# Patient Record
Sex: Female | Born: 1955 | Race: White | Hispanic: No | Marital: Married | Smoking: Former smoker
Health system: Southern US, Community
[De-identification: ages and names within clinical notes are randomized; demographics above are authoritative.]

## PROBLEM LIST (undated history)

## (undated) DIAGNOSIS — E119 Type 2 diabetes mellitus without complications: Secondary | ICD-10-CM

## (undated) DIAGNOSIS — I10 Essential (primary) hypertension: Secondary | ICD-10-CM

## (undated) DIAGNOSIS — Z923 Personal history of irradiation: Secondary | ICD-10-CM

## (undated) HISTORY — PX: DILATION AND CURETTAGE OF UTERUS: SHX78

---

## 1992-06-28 HISTORY — PX: DERMOID CYST  EXCISION: SHX1452

## 2008-10-22 ENCOUNTER — Other Ambulatory Visit: Admission: RE | Admit: 2008-10-22 | Discharge: 2008-10-22 | Payer: Self-pay | Admitting: Family Medicine

## 2009-10-23 ENCOUNTER — Other Ambulatory Visit: Admission: RE | Admit: 2009-10-23 | Discharge: 2009-10-23 | Payer: Self-pay | Admitting: Family Medicine

## 2010-12-28 ENCOUNTER — Other Ambulatory Visit (HOSPITAL_COMMUNITY)
Admission: RE | Admit: 2010-12-28 | Discharge: 2010-12-28 | Disposition: A | Discharge status: Home / Self Care | Payer: PRIVATE HEALTH INSURANCE | Source: Ambulatory Visit | Attending: Family Medicine | Admitting: Family Medicine

## 2010-12-28 DIAGNOSIS — Z124 Encounter for screening for malignant neoplasm of cervix: Secondary | ICD-10-CM | POA: Insufficient documentation

## 2011-06-29 HISTORY — PX: BREAST BIOPSY: SHX20

## 2011-11-19 ENCOUNTER — Other Ambulatory Visit: Payer: Self-pay | Admitting: Family Medicine

## 2011-11-19 DIAGNOSIS — Z1231 Encounter for screening mammogram for malignant neoplasm of breast: Secondary | ICD-10-CM

## 2011-12-13 ENCOUNTER — Ambulatory Visit
Admission: RE | Admit: 2011-12-13 | Discharge: 2011-12-13 | Disposition: A | Discharge status: Home / Self Care | Payer: BC Managed Care – PPO | Source: Ambulatory Visit | Attending: Family Medicine | Admitting: Family Medicine

## 2011-12-13 DIAGNOSIS — Z1231 Encounter for screening mammogram for malignant neoplasm of breast: Secondary | ICD-10-CM

## 2011-12-15 ENCOUNTER — Other Ambulatory Visit: Payer: Self-pay | Admitting: Family Medicine

## 2011-12-15 DIAGNOSIS — R928 Other abnormal and inconclusive findings on diagnostic imaging of breast: Secondary | ICD-10-CM

## 2011-12-27 ENCOUNTER — Ambulatory Visit
Admission: RE | Admit: 2011-12-27 | Discharge: 2011-12-27 | Disposition: A | Discharge status: Home / Self Care | Payer: BC Managed Care – PPO | Source: Ambulatory Visit | Attending: Family Medicine | Admitting: Family Medicine

## 2011-12-27 ENCOUNTER — Other Ambulatory Visit: Payer: Self-pay | Admitting: Family Medicine

## 2011-12-27 DIAGNOSIS — R928 Other abnormal and inconclusive findings on diagnostic imaging of breast: Secondary | ICD-10-CM

## 2011-12-27 DIAGNOSIS — R921 Mammographic calcification found on diagnostic imaging of breast: Secondary | ICD-10-CM

## 2012-11-30 ENCOUNTER — Other Ambulatory Visit: Payer: Self-pay

## 2012-11-30 DIAGNOSIS — Z1231 Encounter for screening mammogram for malignant neoplasm of breast: Secondary | ICD-10-CM

## 2013-01-01 ENCOUNTER — Ambulatory Visit
Admission: RE | Admit: 2013-01-01 | Discharge: 2013-01-01 | Disposition: A | Discharge status: Home / Self Care | Payer: BC Managed Care – PPO | Source: Ambulatory Visit

## 2013-01-01 DIAGNOSIS — Z1231 Encounter for screening mammogram for malignant neoplasm of breast: Secondary | ICD-10-CM

## 2013-01-04 ENCOUNTER — Other Ambulatory Visit: Payer: Self-pay | Admitting: Family Medicine

## 2013-01-04 DIAGNOSIS — R928 Other abnormal and inconclusive findings on diagnostic imaging of breast: Secondary | ICD-10-CM

## 2013-01-15 ENCOUNTER — Ambulatory Visit
Admission: RE | Admit: 2013-01-15 | Discharge: 2013-01-15 | Disposition: A | Discharge status: Home / Self Care | Payer: BC Managed Care – PPO | Source: Ambulatory Visit | Attending: Family Medicine | Admitting: Family Medicine

## 2013-01-15 DIAGNOSIS — R928 Other abnormal and inconclusive findings on diagnostic imaging of breast: Secondary | ICD-10-CM

## 2014-01-31 ENCOUNTER — Other Ambulatory Visit (HOSPITAL_COMMUNITY)
Admission: RE | Admit: 2014-01-31 | Discharge: 2014-01-31 | Disposition: A | Discharge status: Home / Self Care | Payer: BC Managed Care – PPO | Source: Ambulatory Visit | Attending: Family Medicine | Admitting: Family Medicine

## 2014-01-31 ENCOUNTER — Other Ambulatory Visit: Payer: Self-pay | Admitting: Family Medicine

## 2014-01-31 DIAGNOSIS — Z124 Encounter for screening for malignant neoplasm of cervix: Secondary | ICD-10-CM | POA: Insufficient documentation

## 2014-02-05 LAB — CYTOLOGY - PAP

## 2014-02-28 ENCOUNTER — Other Ambulatory Visit: Payer: Self-pay

## 2014-02-28 DIAGNOSIS — Z1231 Encounter for screening mammogram for malignant neoplasm of breast: Secondary | ICD-10-CM

## 2014-03-14 ENCOUNTER — Ambulatory Visit
Admission: RE | Admit: 2014-03-14 | Discharge: 2014-03-14 | Disposition: A | Discharge status: Home / Self Care | Payer: BC Managed Care – PPO | Source: Ambulatory Visit

## 2014-03-14 DIAGNOSIS — Z1231 Encounter for screening mammogram for malignant neoplasm of breast: Secondary | ICD-10-CM

## 2015-02-24 ENCOUNTER — Other Ambulatory Visit: Payer: Self-pay

## 2015-02-24 DIAGNOSIS — Z1231 Encounter for screening mammogram for malignant neoplasm of breast: Secondary | ICD-10-CM

## 2015-04-09 ENCOUNTER — Ambulatory Visit
Admission: RE | Admit: 2015-04-09 | Discharge: 2015-04-09 | Disposition: A | Discharge status: Home / Self Care | Payer: BLUE CROSS/BLUE SHIELD | Source: Ambulatory Visit

## 2015-04-09 DIAGNOSIS — Z1231 Encounter for screening mammogram for malignant neoplasm of breast: Secondary | ICD-10-CM

## 2015-04-11 ENCOUNTER — Other Ambulatory Visit: Payer: Self-pay | Admitting: Family Medicine

## 2015-04-11 DIAGNOSIS — R928 Other abnormal and inconclusive findings on diagnostic imaging of breast: Secondary | ICD-10-CM

## 2015-04-18 ENCOUNTER — Ambulatory Visit
Admission: RE | Admit: 2015-04-18 | Discharge: 2015-04-18 | Disposition: A | Discharge status: Home / Self Care | Payer: BLUE CROSS/BLUE SHIELD | Source: Ambulatory Visit | Attending: Family Medicine | Admitting: Family Medicine

## 2015-04-18 DIAGNOSIS — R928 Other abnormal and inconclusive findings on diagnostic imaging of breast: Secondary | ICD-10-CM

## 2016-03-03 DIAGNOSIS — Z0000 Encounter for general adult medical examination without abnormal findings: Secondary | ICD-10-CM | POA: Diagnosis not present

## 2016-03-03 DIAGNOSIS — E559 Vitamin D deficiency, unspecified: Secondary | ICD-10-CM | POA: Diagnosis not present

## 2016-03-03 DIAGNOSIS — E7800 Pure hypercholesterolemia, unspecified: Secondary | ICD-10-CM | POA: Diagnosis not present

## 2016-03-03 DIAGNOSIS — I10 Essential (primary) hypertension: Secondary | ICD-10-CM | POA: Diagnosis not present

## 2016-04-08 DIAGNOSIS — M8588 Other specified disorders of bone density and structure, other site: Secondary | ICD-10-CM | POA: Diagnosis not present

## 2016-04-26 ENCOUNTER — Other Ambulatory Visit: Payer: Self-pay | Admitting: Family Medicine

## 2016-04-26 DIAGNOSIS — Z1231 Encounter for screening mammogram for malignant neoplasm of breast: Secondary | ICD-10-CM

## 2016-05-19 ENCOUNTER — Ambulatory Visit: Payer: BLUE CROSS/BLUE SHIELD

## 2016-07-02 ENCOUNTER — Ambulatory Visit
Admission: RE | Admit: 2016-07-02 | Discharge: 2016-07-02 | Disposition: A | Discharge status: Home / Self Care | Payer: BLUE CROSS/BLUE SHIELD | Source: Ambulatory Visit | Attending: Family Medicine | Admitting: Family Medicine

## 2016-07-02 DIAGNOSIS — Z1231 Encounter for screening mammogram for malignant neoplasm of breast: Secondary | ICD-10-CM

## 2016-07-23 DIAGNOSIS — Z86010 Personal history of colonic polyps: Secondary | ICD-10-CM | POA: Diagnosis not present

## 2016-07-23 DIAGNOSIS — D126 Benign neoplasm of colon, unspecified: Secondary | ICD-10-CM | POA: Diagnosis not present

## 2016-07-23 DIAGNOSIS — K640 First degree hemorrhoids: Secondary | ICD-10-CM | POA: Diagnosis not present

## 2016-07-27 DIAGNOSIS — D126 Benign neoplasm of colon, unspecified: Secondary | ICD-10-CM | POA: Diagnosis not present

## 2016-08-31 DIAGNOSIS — E7800 Pure hypercholesterolemia, unspecified: Secondary | ICD-10-CM | POA: Diagnosis not present

## 2016-08-31 DIAGNOSIS — R7303 Prediabetes: Secondary | ICD-10-CM | POA: Diagnosis not present

## 2016-08-31 DIAGNOSIS — I10 Essential (primary) hypertension: Secondary | ICD-10-CM | POA: Diagnosis not present

## 2017-04-04 ENCOUNTER — Other Ambulatory Visit: Payer: Self-pay | Admitting: Family Medicine

## 2017-04-04 ENCOUNTER — Other Ambulatory Visit (HOSPITAL_COMMUNITY)
Admission: RE | Admit: 2017-04-04 | Discharge: 2017-04-04 | Disposition: A | Discharge status: Home / Self Care | Payer: BLUE CROSS/BLUE SHIELD | Source: Ambulatory Visit | Attending: Family Medicine | Admitting: Family Medicine

## 2017-04-04 DIAGNOSIS — E7800 Pure hypercholesterolemia, unspecified: Secondary | ICD-10-CM | POA: Diagnosis not present

## 2017-04-04 DIAGNOSIS — Z124 Encounter for screening for malignant neoplasm of cervix: Secondary | ICD-10-CM | POA: Diagnosis not present

## 2017-04-04 DIAGNOSIS — Z0000 Encounter for general adult medical examination without abnormal findings: Secondary | ICD-10-CM | POA: Diagnosis not present

## 2017-04-04 DIAGNOSIS — E559 Vitamin D deficiency, unspecified: Secondary | ICD-10-CM | POA: Diagnosis not present

## 2017-04-05 LAB — CYTOLOGY - PAP
Diagnosis: NEGATIVE
HPV: NOT DETECTED

## 2017-06-30 DIAGNOSIS — B349 Viral infection, unspecified: Secondary | ICD-10-CM | POA: Diagnosis not present

## 2017-09-09 ENCOUNTER — Other Ambulatory Visit: Payer: Self-pay | Admitting: Family Medicine

## 2017-09-09 DIAGNOSIS — Z1231 Encounter for screening mammogram for malignant neoplasm of breast: Secondary | ICD-10-CM

## 2017-09-30 ENCOUNTER — Ambulatory Visit: Payer: BLUE CROSS/BLUE SHIELD

## 2017-10-04 ENCOUNTER — Ambulatory Visit
Admission: RE | Admit: 2017-10-04 | Discharge: 2017-10-04 | Disposition: A | Discharge status: Home / Self Care | Payer: BLUE CROSS/BLUE SHIELD | Source: Ambulatory Visit | Attending: Family Medicine | Admitting: Family Medicine

## 2017-10-04 DIAGNOSIS — E7800 Pure hypercholesterolemia, unspecified: Secondary | ICD-10-CM | POA: Diagnosis not present

## 2017-10-04 DIAGNOSIS — Z1231 Encounter for screening mammogram for malignant neoplasm of breast: Secondary | ICD-10-CM | POA: Diagnosis not present

## 2017-10-04 DIAGNOSIS — R7303 Prediabetes: Secondary | ICD-10-CM | POA: Diagnosis not present

## 2017-10-04 DIAGNOSIS — I10 Essential (primary) hypertension: Secondary | ICD-10-CM | POA: Diagnosis not present

## 2017-11-13 DIAGNOSIS — J069 Acute upper respiratory infection, unspecified: Secondary | ICD-10-CM | POA: Diagnosis not present

## 2017-12-17 DIAGNOSIS — Z6832 Body mass index (BMI) 32.0-32.9, adult: Secondary | ICD-10-CM | POA: Diagnosis not present

## 2017-12-17 DIAGNOSIS — H1011 Acute atopic conjunctivitis, right eye: Secondary | ICD-10-CM | POA: Diagnosis not present

## 2017-12-17 DIAGNOSIS — H109 Unspecified conjunctivitis: Secondary | ICD-10-CM | POA: Diagnosis not present

## 2018-01-09 DIAGNOSIS — E1165 Type 2 diabetes mellitus with hyperglycemia: Secondary | ICD-10-CM | POA: Diagnosis not present

## 2018-01-27 ENCOUNTER — Other Ambulatory Visit: Payer: Self-pay | Admitting: Family Medicine

## 2018-01-27 DIAGNOSIS — C44529 Squamous cell carcinoma of skin of other part of trunk: Secondary | ICD-10-CM | POA: Diagnosis not present

## 2018-02-14 DIAGNOSIS — C44529 Squamous cell carcinoma of skin of other part of trunk: Secondary | ICD-10-CM | POA: Diagnosis not present

## 2018-03-28 DIAGNOSIS — Z85828 Personal history of other malignant neoplasm of skin: Secondary | ICD-10-CM | POA: Diagnosis not present

## 2018-03-28 DIAGNOSIS — L821 Other seborrheic keratosis: Secondary | ICD-10-CM | POA: Diagnosis not present

## 2018-03-28 DIAGNOSIS — L814 Other melanin hyperpigmentation: Secondary | ICD-10-CM | POA: Diagnosis not present

## 2018-03-28 DIAGNOSIS — D1801 Hemangioma of skin and subcutaneous tissue: Secondary | ICD-10-CM | POA: Diagnosis not present

## 2018-04-04 DIAGNOSIS — Z23 Encounter for immunization: Secondary | ICD-10-CM | POA: Diagnosis not present

## 2018-04-04 DIAGNOSIS — E1169 Type 2 diabetes mellitus with other specified complication: Secondary | ICD-10-CM | POA: Diagnosis not present

## 2018-04-04 DIAGNOSIS — I10 Essential (primary) hypertension: Secondary | ICD-10-CM | POA: Diagnosis not present

## 2018-04-04 DIAGNOSIS — Z0000 Encounter for general adult medical examination without abnormal findings: Secondary | ICD-10-CM | POA: Diagnosis not present

## 2018-04-04 DIAGNOSIS — E7800 Pure hypercholesterolemia, unspecified: Secondary | ICD-10-CM | POA: Diagnosis not present

## 2018-04-04 DIAGNOSIS — J45909 Unspecified asthma, uncomplicated: Secondary | ICD-10-CM | POA: Diagnosis not present

## 2018-10-05 DIAGNOSIS — I10 Essential (primary) hypertension: Secondary | ICD-10-CM | POA: Diagnosis not present

## 2018-10-05 DIAGNOSIS — E1169 Type 2 diabetes mellitus with other specified complication: Secondary | ICD-10-CM | POA: Diagnosis not present

## 2018-10-05 DIAGNOSIS — E7800 Pure hypercholesterolemia, unspecified: Secondary | ICD-10-CM | POA: Diagnosis not present

## 2018-10-18 DIAGNOSIS — E1169 Type 2 diabetes mellitus with other specified complication: Secondary | ICD-10-CM | POA: Diagnosis not present

## 2019-03-02 DIAGNOSIS — R238 Other skin changes: Secondary | ICD-10-CM | POA: Diagnosis not present

## 2019-03-02 DIAGNOSIS — L821 Other seborrheic keratosis: Secondary | ICD-10-CM | POA: Diagnosis not present

## 2019-03-02 DIAGNOSIS — D229 Melanocytic nevi, unspecified: Secondary | ICD-10-CM | POA: Diagnosis not present

## 2019-03-02 DIAGNOSIS — D485 Neoplasm of uncertain behavior of skin: Secondary | ICD-10-CM | POA: Diagnosis not present

## 2019-03-02 DIAGNOSIS — L989 Disorder of the skin and subcutaneous tissue, unspecified: Secondary | ICD-10-CM | POA: Diagnosis not present

## 2019-03-02 DIAGNOSIS — L820 Inflamed seborrheic keratosis: Secondary | ICD-10-CM | POA: Diagnosis not present

## 2019-04-02 DIAGNOSIS — Z23 Encounter for immunization: Secondary | ICD-10-CM | POA: Diagnosis not present

## 2019-04-03 DIAGNOSIS — D485 Neoplasm of uncertain behavior of skin: Secondary | ICD-10-CM | POA: Diagnosis not present

## 2019-04-03 DIAGNOSIS — L905 Scar conditions and fibrosis of skin: Secondary | ICD-10-CM | POA: Diagnosis not present

## 2019-04-16 ENCOUNTER — Other Ambulatory Visit: Payer: Self-pay | Admitting: Family Medicine

## 2019-04-16 DIAGNOSIS — Z1231 Encounter for screening mammogram for malignant neoplasm of breast: Secondary | ICD-10-CM

## 2019-04-16 DIAGNOSIS — I10 Essential (primary) hypertension: Secondary | ICD-10-CM | POA: Diagnosis not present

## 2019-04-16 DIAGNOSIS — E7800 Pure hypercholesterolemia, unspecified: Secondary | ICD-10-CM | POA: Diagnosis not present

## 2019-04-16 DIAGNOSIS — E559 Vitamin D deficiency, unspecified: Secondary | ICD-10-CM | POA: Diagnosis not present

## 2019-04-16 DIAGNOSIS — Z0000 Encounter for general adult medical examination without abnormal findings: Secondary | ICD-10-CM | POA: Diagnosis not present

## 2019-04-16 DIAGNOSIS — J45909 Unspecified asthma, uncomplicated: Secondary | ICD-10-CM | POA: Diagnosis not present

## 2019-04-16 DIAGNOSIS — E1169 Type 2 diabetes mellitus with other specified complication: Secondary | ICD-10-CM | POA: Diagnosis not present

## 2019-04-16 DIAGNOSIS — M8580 Other specified disorders of bone density and structure, unspecified site: Secondary | ICD-10-CM

## 2019-07-11 ENCOUNTER — Ambulatory Visit
Admission: RE | Admit: 2019-07-11 | Discharge: 2019-07-11 | Disposition: A | Discharge status: Home / Self Care | Payer: 59 | Source: Ambulatory Visit | Attending: Family Medicine | Admitting: Family Medicine

## 2019-07-11 ENCOUNTER — Other Ambulatory Visit: Payer: Self-pay

## 2019-07-11 DIAGNOSIS — Z1231 Encounter for screening mammogram for malignant neoplasm of breast: Secondary | ICD-10-CM

## 2019-07-11 DIAGNOSIS — M8580 Other specified disorders of bone density and structure, unspecified site: Secondary | ICD-10-CM

## 2021-03-12 DIAGNOSIS — Z85828 Personal history of other malignant neoplasm of skin: Secondary | ICD-10-CM | POA: Diagnosis not present

## 2021-03-12 DIAGNOSIS — L814 Other melanin hyperpigmentation: Secondary | ICD-10-CM | POA: Diagnosis not present

## 2021-03-12 DIAGNOSIS — Z08 Encounter for follow-up examination after completed treatment for malignant neoplasm: Secondary | ICD-10-CM | POA: Diagnosis not present

## 2021-03-12 DIAGNOSIS — L821 Other seborrheic keratosis: Secondary | ICD-10-CM | POA: Diagnosis not present

## 2021-03-12 DIAGNOSIS — D229 Melanocytic nevi, unspecified: Secondary | ICD-10-CM | POA: Diagnosis not present

## 2021-03-12 DIAGNOSIS — D485 Neoplasm of uncertain behavior of skin: Secondary | ICD-10-CM | POA: Diagnosis not present

## 2021-03-12 DIAGNOSIS — D1801 Hemangioma of skin and subcutaneous tissue: Secondary | ICD-10-CM | POA: Diagnosis not present

## 2021-07-09 DIAGNOSIS — E7800 Pure hypercholesterolemia, unspecified: Secondary | ICD-10-CM | POA: Diagnosis not present

## 2021-07-09 DIAGNOSIS — M859 Disorder of bone density and structure, unspecified: Secondary | ICD-10-CM | POA: Diagnosis not present

## 2021-07-09 DIAGNOSIS — E1169 Type 2 diabetes mellitus with other specified complication: Secondary | ICD-10-CM | POA: Diagnosis not present

## 2021-07-09 DIAGNOSIS — Z86010 Personal history of colonic polyps: Secondary | ICD-10-CM | POA: Diagnosis not present

## 2021-07-09 DIAGNOSIS — Z23 Encounter for immunization: Secondary | ICD-10-CM | POA: Diagnosis not present

## 2021-07-09 DIAGNOSIS — C4490 Unspecified malignant neoplasm of skin, unspecified: Secondary | ICD-10-CM | POA: Diagnosis not present

## 2021-07-09 DIAGNOSIS — Z0000 Encounter for general adult medical examination without abnormal findings: Secondary | ICD-10-CM | POA: Diagnosis not present

## 2021-07-09 DIAGNOSIS — J45909 Unspecified asthma, uncomplicated: Secondary | ICD-10-CM | POA: Diagnosis not present

## 2021-07-09 DIAGNOSIS — E559 Vitamin D deficiency, unspecified: Secondary | ICD-10-CM | POA: Diagnosis not present

## 2021-07-09 DIAGNOSIS — N951 Menopausal and female climacteric states: Secondary | ICD-10-CM | POA: Diagnosis not present

## 2021-07-09 DIAGNOSIS — I10 Essential (primary) hypertension: Secondary | ICD-10-CM | POA: Diagnosis not present

## 2021-07-09 DIAGNOSIS — Z1159 Encounter for screening for other viral diseases: Secondary | ICD-10-CM | POA: Diagnosis not present

## 2021-09-03 ENCOUNTER — Other Ambulatory Visit: Payer: Self-pay | Admitting: Family Medicine

## 2021-09-03 DIAGNOSIS — Z1231 Encounter for screening mammogram for malignant neoplasm of breast: Secondary | ICD-10-CM

## 2021-09-10 DIAGNOSIS — C44529 Squamous cell carcinoma of skin of other part of trunk: Secondary | ICD-10-CM | POA: Diagnosis not present

## 2021-09-10 DIAGNOSIS — R229 Localized swelling, mass and lump, unspecified: Secondary | ICD-10-CM | POA: Diagnosis not present

## 2021-09-10 DIAGNOSIS — Z08 Encounter for follow-up examination after completed treatment for malignant neoplasm: Secondary | ICD-10-CM | POA: Diagnosis not present

## 2021-09-10 DIAGNOSIS — D2362 Other benign neoplasm of skin of left upper limb, including shoulder: Secondary | ICD-10-CM | POA: Diagnosis not present

## 2021-09-10 DIAGNOSIS — D2372 Other benign neoplasm of skin of left lower limb, including hip: Secondary | ICD-10-CM | POA: Diagnosis not present

## 2021-09-10 DIAGNOSIS — D2371 Other benign neoplasm of skin of right lower limb, including hip: Secondary | ICD-10-CM | POA: Diagnosis not present

## 2021-09-10 DIAGNOSIS — Z85828 Personal history of other malignant neoplasm of skin: Secondary | ICD-10-CM | POA: Diagnosis not present

## 2021-09-10 DIAGNOSIS — L814 Other melanin hyperpigmentation: Secondary | ICD-10-CM | POA: Diagnosis not present

## 2021-09-10 DIAGNOSIS — L821 Other seborrheic keratosis: Secondary | ICD-10-CM | POA: Diagnosis not present

## 2021-09-14 ENCOUNTER — Other Ambulatory Visit: Payer: Self-pay

## 2021-09-14 ENCOUNTER — Ambulatory Visit
Admission: RE | Admit: 2021-09-14 | Discharge: 2021-09-14 | Disposition: A | Discharge status: Home / Self Care | Payer: 59 | Source: Ambulatory Visit | Attending: Family Medicine | Admitting: Family Medicine

## 2021-09-14 DIAGNOSIS — Z1231 Encounter for screening mammogram for malignant neoplasm of breast: Secondary | ICD-10-CM

## 2021-09-16 ENCOUNTER — Other Ambulatory Visit: Payer: Self-pay | Admitting: Family Medicine

## 2021-09-16 DIAGNOSIS — R928 Other abnormal and inconclusive findings on diagnostic imaging of breast: Secondary | ICD-10-CM

## 2021-10-01 ENCOUNTER — Other Ambulatory Visit: Payer: Self-pay | Admitting: Family Medicine

## 2021-10-01 ENCOUNTER — Ambulatory Visit
Admission: RE | Admit: 2021-10-01 | Discharge: 2021-10-01 | Disposition: A | Discharge status: Home / Self Care | Payer: 59 | Source: Ambulatory Visit | Attending: Family Medicine | Admitting: Family Medicine

## 2021-10-01 DIAGNOSIS — R928 Other abnormal and inconclusive findings on diagnostic imaging of breast: Secondary | ICD-10-CM

## 2021-10-01 DIAGNOSIS — R921 Mammographic calcification found on diagnostic imaging of breast: Secondary | ICD-10-CM

## 2021-10-15 ENCOUNTER — Ambulatory Visit
Admission: RE | Admit: 2021-10-15 | Discharge: 2021-10-15 | Disposition: A | Discharge status: Home / Self Care | Payer: Medicare Other | Source: Ambulatory Visit | Attending: Family Medicine | Admitting: Family Medicine

## 2021-10-15 DIAGNOSIS — R921 Mammographic calcification found on diagnostic imaging of breast: Secondary | ICD-10-CM

## 2021-10-15 DIAGNOSIS — C50919 Malignant neoplasm of unspecified site of unspecified female breast: Secondary | ICD-10-CM

## 2021-10-15 DIAGNOSIS — D0512 Intraductal carcinoma in situ of left breast: Secondary | ICD-10-CM | POA: Diagnosis not present

## 2021-10-15 HISTORY — DX: Malignant neoplasm of unspecified site of unspecified female breast: C50.919

## 2021-10-30 ENCOUNTER — Ambulatory Visit: Payer: Self-pay | Admitting: Surgery

## 2021-10-30 DIAGNOSIS — D0512 Intraductal carcinoma in situ of left breast: Secondary | ICD-10-CM | POA: Diagnosis not present

## 2021-10-30 DIAGNOSIS — E1169 Type 2 diabetes mellitus with other specified complication: Secondary | ICD-10-CM | POA: Insufficient documentation

## 2021-10-30 DIAGNOSIS — I10 Essential (primary) hypertension: Secondary | ICD-10-CM | POA: Insufficient documentation

## 2021-10-30 NOTE — H&P (Signed)
?Subjective  ?  ?Chief Complaint: Breast Cancer (Left breast cancer) ?  ?  ?  ?History of Present Illness: ?Cynthia Baker is a 66 y.o. female who is seen today as an office consultation at the request of Dr. Laurann Montana for evaluation of Breast Cancer (Left breast cancer) ?.   ?  ?This is a 66 year old female with hypertension, diabetes type 2, hypercholesterolemia, who presents after a recent screening mammogram showed a cluster of calcifications in the left posterior medial breast.  She underwent stereotactic biopsy of this 1.0 x 0.4 x 0.5 cm area of calcifications.  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 ?  ?The patient has a younger sister who had breast cancer approximately 30 years ago.  No genetic testing has been performed. ?  ?  ?Review of Systems: ?A complete review of systems was obtained from the patient.  I have reviewed this information and discussed as appropriate with the patient.  See HPI as well for other ROS. ?  ?Review of Systems  ?Constitutional: Negative.   ?HENT: Positive for hearing loss and tinnitus.   ?Eyes: Negative.   ?Respiratory: Negative.   ?Cardiovascular: Negative.   ?Gastrointestinal: Negative.   ?Genitourinary: Negative.   ?Musculoskeletal: Negative.   ?Skin: Negative.   ?Neurological: Negative.   ?Endo/Heme/Allergies: Negative.   ?Psychiatric/Behavioral: Negative.   ?  ?  ?  ?Medical History: ?Past Medical History  ?    ?Past Medical History:  ?Diagnosis Date  ? Hyperlipidemia    ? Hypertension    ?  ?  ?  ?   ?Patient Active Problem List  ?Diagnosis  ? Ductal carcinoma in situ (DCIS) of left breast  ? 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 Essential (primary) hypertension  ?  ?  ?Past Surgical History  ?     ?Past Surgical History:  ?Procedure Laterality Date  ? dermoid cyst removal   1994  ?  ?  ?  ?Allergies  ?    ?Allergies  ?Allergen Reactions  ? Nsaids (Non-Steroidal Anti-Inflammatory Drug) Cough  ?  ?  ?  ?      ?Current  Outpatient Medications on File Prior to Visit  ?Medication Sig Dispense Refill  ? atorvastatin (LIPITOR) 20 MG tablet atorvastatin 20 mg tablet      ? cholecalciferol 1000 unit tablet Take by mouth      ? losartan-hydroCHLOROthiazide (HYZAAR) 100-12.5 mg tablet losartan 100 mg-hydrochlorothiazide 12.5 mg tablet      ? TRIJARDY XR 5-2.5-1,000 mg XR 24 hr bipahsic tablet        ?  ?No current facility-administered medications on file prior to visit.  ?  ?  ?Family History  ?     ?Family History  ?Problem Relation Age of Onset  ? High blood pressure (Hypertension) Mother    ? Breast cancer Sister    ?  ?  ?  ?Social History  ?  ?    ?Tobacco Use  ?Smoking Status Former  ? Types: Cigarettes  ? Quit date: 72  ? Years since quitting: 33.3  ?Smokeless Tobacco Never  ?  ?  ?Social History  ?Social History  ?  ?     ?Socioeconomic History  ? Marital status: Married  ?Tobacco Use  ? Smoking status: Former  ?    Types: Cigarettes  ?    Quit date: 81  ?    Years since quitting: 33.3  ? Smokeless tobacco: Never  ?Vaping Use  ?  Vaping Use: Never used  ?Substance and Sexual Activity  ? Alcohol use: Yes  ? Drug use: Never  ?  ?  ?  ?Objective:  ?  ?  ?   ?Vitals:  ?  10/30/21 1115  ?BP: 112/76  ?Pulse: 110  ?Temp: 36.7 ?C (98.1 ?F)  ?SpO2: 96%  ?Weight: 94.1 kg (207 lb 6 oz)  ?Height: 176.5 cm (5' 9.5")  ?  ?Body mass index is 30.18 kg/m?. ?  ?Physical Exam  ?  ?Constitutional:  WDWN in NAD, conversant, no obvious deformities; lying in bed comfortably ?Eyes:  Pupils equal, round; sclera anicteric; moist conjunctiva; no lid lag ?HENT:  Oral mucosa moist; good dentition  ?Neck:  No masses palpated, trachea midline; no thyromegaly ?Lungs:  CTA bilaterally; normal respiratory effort ?Breasts:  symmetric, no nipple changes; no palpable masses or lymphadenopathy on either side ?CV:  Regular rate and rhythm; no murmurs; extremities well-perfused with no edema ?Abd:  +bowel sounds, soft, non-tender, no palpable organomegaly; no  palpable hernias ?Musc:  Normal gait; no apparent clubbing or cyanosis in extremities ?Lymphatic:  No palpable cervical or axillary lymphadenopathy ?Skin:  Warm, dry; no sign of jaundice ?Psychiatric - alert and oriented x 4; calm mood and affect ?  ?  ?Labs, Imaging and Diagnostic Testing: ?DCIS intermediate grade - ER/ PR ?  ?CLINICAL DATA:  Patient returns after screening for evaluation of ?LEFT breast calcifications. ?  ?EXAM: ?DIGITAL DIAGNOSTIC UNILATERAL LEFT MAMMOGRAM WITH CAD ?  ?TECHNIQUE: ?Left digital diagnostic mammography was performed. Mammographic ?images were processed with CAD. ?  ?COMPARISON:  09/14/2021 and earlier ?  ?ACR Breast Density Category b: There are scattered areas of ?fibroglandular density. ?  ?FINDINGS: ?Magnified views are performed of calcifications in the posteromedial ?aspect of the LEFT breast. These views demonstrate a group of faint ?linear and branching calcifications associated with parenchymal ?density. This measures 0.4 x 1.0 x 0.5 Centimeters. ?  ?IMPRESSION: ?Indeterminate LEFT breast calcifications warranting tissue ?diagnosis. ?  ?RECOMMENDATION: ?Stereotactic biopsy of LEFT breast calcifications. ?  ?I have discussed the findings and recommendations with the patient. ?If applicable, a reminder letter will be sent to the patient ?regarding the next appointment. ?  ?BI-RADS CATEGORY  4: Suspicious. ?  ?  ?Electronically Signed ?  By: Nolon Nations M.D. ?  On: 10/01/2021 12:02 ?  ?  ?Assessment and Plan:  ?Diagnoses and all orders for this visit: ?  ?Ductal carcinoma in situ (DCIS) of left breast ?-     Ambulatory Referral to Oncology-Medical ?-     Ambulatory Referral to Radiation Oncology ?-     Ambulatory Referral to Genetics ?  ?  ?Spent time with the patient and her husband discussing her disease.  We discussed the different stages of ductal carcinoma.  She does have cancer cells in her breast but they are still confined to the duct.  We discussed the possibility  of finding invasive cancer at the time of surgery.  We discussed the possibility of need for subsequent sentinel lymph node biopsy.  We discussed the surgical options including mastectomy versus lumpectomy.  She is a good candidate for breast conserving therapy with lumpectomy likely followed by radiation.  We discussed the ER/PR positive status of her breast cancer and the likely treatment with adjuvant antiestrogens.  Considering her family history of breast cancer in her sister when her sister was fairly young, we will refer her to genetics for possible genetic testing.  The patient does have 2 daughters  and multiple sisters.  She is obviously quite concerned about them. ?  ?  ?Left radioactive seed localized lumpectomy.  The surgical procedure has been discussed with the patient.  Potential risks, benefits, alternative treatments, and expected outcomes have been explained.  All of the patient's questions at this time have been answered.  The likelihood of reaching the patient's treatment goal is good.  The patient understand the proposed surgical procedure and wishes to proceed. ?  ?  ?Fleetwood Pierron Jearld Adjutant, MD  ?10/30/2021 ?12:06 PM ?  ?

## 2021-10-30 NOTE — H&P (View-Only) (Signed)
?Subjective  ?  ?Chief Complaint: Breast Cancer (Left breast cancer) ?  ?  ?  ?History of Present Illness: ?Cynthia Baker is a 66 y.o. female who is seen today as an office consultation at the request of Dr. Laurann Montana for evaluation of Breast Cancer (Left breast cancer) ?.   ?  ?This is a 66 year old female with hypertension, diabetes type 2, hypercholesterolemia, who presents after a recent screening mammogram showed a cluster of calcifications in the left posterior medial breast.  She underwent stereotactic biopsy of this 1.0 x 0.4 x 0.5 cm area of calcifications.  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 ?  ?The patient has a younger sister who had breast cancer approximately 30 years ago.  No genetic testing has been performed. ?  ?  ?Review of Systems: ?A complete review of systems was obtained from the patient.  I have reviewed this information and discussed as appropriate with the patient.  See HPI as well for other ROS. ?  ?Review of Systems  ?Constitutional: Negative.   ?HENT: Positive for hearing loss and tinnitus.   ?Eyes: Negative.   ?Respiratory: Negative.   ?Cardiovascular: Negative.   ?Gastrointestinal: Negative.   ?Genitourinary: Negative.   ?Musculoskeletal: Negative.   ?Skin: Negative.   ?Neurological: Negative.   ?Endo/Heme/Allergies: Negative.   ?Psychiatric/Behavioral: Negative.   ?  ?  ?  ?Medical History: ?Past Medical History  ?    ?Past Medical History:  ?Diagnosis Date  ? Hyperlipidemia    ? Hypertension    ?  ?  ?  ?   ?Patient Active Problem List  ?Diagnosis  ? Ductal carcinoma in situ (DCIS) of left breast  ? 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 Essential (primary) hypertension  ?  ?  ?Past Surgical History  ?     ?Past Surgical History:  ?Procedure Laterality Date  ? dermoid cyst removal   1994  ?  ?  ?  ?Allergies  ?    ?Allergies  ?Allergen Reactions  ? Nsaids (Non-Steroidal Anti-Inflammatory Drug) Cough  ?  ?  ?  ?      ?Current  Outpatient Medications on File Prior to Visit  ?Medication Sig Dispense Refill  ? atorvastatin (LIPITOR) 20 MG tablet atorvastatin 20 mg tablet      ? cholecalciferol 1000 unit tablet Take by mouth      ? losartan-hydroCHLOROthiazide (HYZAAR) 100-12.5 mg tablet losartan 100 mg-hydrochlorothiazide 12.5 mg tablet      ? TRIJARDY XR 5-2.5-1,000 mg XR 24 hr bipahsic tablet        ?  ?No current facility-administered medications on file prior to visit.  ?  ?  ?Family History  ?     ?Family History  ?Problem Relation Age of Onset  ? High blood pressure (Hypertension) Mother    ? Breast cancer Sister    ?  ?  ?  ?Social History  ?  ?    ?Tobacco Use  ?Smoking Status Former  ? Types: Cigarettes  ? Quit date: 30  ? Years since quitting: 33.3  ?Smokeless Tobacco Never  ?  ?  ?Social History  ?Social History  ?  ?     ?Socioeconomic History  ? Marital status: Married  ?Tobacco Use  ? Smoking status: Former  ?    Types: Cigarettes  ?    Quit date: 42  ?    Years since quitting: 33.3  ? Smokeless tobacco: Never  ?Vaping Use  ?  Vaping Use: Never used  ?Substance and Sexual Activity  ? Alcohol use: Yes  ? Drug use: Never  ?  ?  ?  ?Objective:  ?  ?  ?   ?Vitals:  ?  10/30/21 1115  ?BP: 112/76  ?Pulse: 110  ?Temp: 36.7 ?C (98.1 ?F)  ?SpO2: 96%  ?Weight: 94.1 kg (207 lb 6 oz)  ?Height: 176.5 cm (5' 9.5")  ?  ?Body mass index is 30.18 kg/m?. ?  ?Physical Exam  ?  ?Constitutional:  WDWN in NAD, conversant, no obvious deformities; lying in bed comfortably ?Eyes:  Pupils equal, round; sclera anicteric; moist conjunctiva; no lid lag ?HENT:  Oral mucosa moist; good dentition  ?Neck:  No masses palpated, trachea midline; no thyromegaly ?Lungs:  CTA bilaterally; normal respiratory effort ?Breasts:  symmetric, no nipple changes; no palpable masses or lymphadenopathy on either side ?CV:  Regular rate and rhythm; no murmurs; extremities well-perfused with no edema ?Abd:  +bowel sounds, soft, non-tender, no palpable organomegaly; no  palpable hernias ?Musc:  Normal gait; no apparent clubbing or cyanosis in extremities ?Lymphatic:  No palpable cervical or axillary lymphadenopathy ?Skin:  Warm, dry; no sign of jaundice ?Psychiatric - alert and oriented x 4; calm mood and affect ?  ?  ?Labs, Imaging and Diagnostic Testing: ?DCIS intermediate grade - ER/ PR ?  ?CLINICAL DATA:  Patient returns after screening for evaluation of ?LEFT breast calcifications. ?  ?EXAM: ?DIGITAL DIAGNOSTIC UNILATERAL LEFT MAMMOGRAM WITH CAD ?  ?TECHNIQUE: ?Left digital diagnostic mammography was performed. Mammographic ?images were processed with CAD. ?  ?COMPARISON:  09/14/2021 and earlier ?  ?ACR Breast Density Category b: There are scattered areas of ?fibroglandular density. ?  ?FINDINGS: ?Magnified views are performed of calcifications in the posteromedial ?aspect of the LEFT breast. These views demonstrate a group of faint ?linear and branching calcifications associated with parenchymal ?density. This measures 0.4 x 1.0 x 0.5 Centimeters. ?  ?IMPRESSION: ?Indeterminate LEFT breast calcifications warranting tissue ?diagnosis. ?  ?RECOMMENDATION: ?Stereotactic biopsy of LEFT breast calcifications. ?  ?I have discussed the findings and recommendations with the patient. ?If applicable, a reminder letter will be sent to the patient ?regarding the next appointment. ?  ?BI-RADS CATEGORY  4: Suspicious. ?  ?  ?Electronically Signed ?  By: Nolon Nations M.D. ?  On: 10/01/2021 12:02 ?  ?  ?Assessment and Plan:  ?Diagnoses and all orders for this visit: ?  ?Ductal carcinoma in situ (DCIS) of left breast ?-     Ambulatory Referral to Oncology-Medical ?-     Ambulatory Referral to Radiation Oncology ?-     Ambulatory Referral to Genetics ?  ?  ?Spent time with the patient and her husband discussing her disease.  We discussed the different stages of ductal carcinoma.  She does have cancer cells in her breast but they are still confined to the duct.  We discussed the possibility  of finding invasive cancer at the time of surgery.  We discussed the possibility of need for subsequent sentinel lymph node biopsy.  We discussed the surgical options including mastectomy versus lumpectomy.  She is a good candidate for breast conserving therapy with lumpectomy likely followed by radiation.  We discussed the ER/PR positive status of her breast cancer and the likely treatment with adjuvant antiestrogens.  Considering her family history of breast cancer in her sister when her sister was fairly young, we will refer her to genetics for possible genetic testing.  The patient does have 2 daughters  and multiple sisters.  She is obviously quite concerned about them. ?  ?  ?Left radioactive seed localized lumpectomy.  The surgical procedure has been discussed with the patient.  Potential risks, benefits, alternative treatments, and expected outcomes have been explained.  All of the patient's questions at this time have been answered.  The likelihood of reaching the patient's treatment goal is good.  The patient understand the proposed surgical procedure and wishes to proceed. ?  ?  ?Airam Heidecker Jearld Adjutant, MD  ?10/30/2021 ?12:06 PM ?  ?

## 2021-11-02 ENCOUNTER — Other Ambulatory Visit: Payer: Self-pay | Admitting: Surgery

## 2021-11-02 ENCOUNTER — Telehealth: Payer: Self-pay | Admitting: Hematology and Oncology

## 2021-11-02 ENCOUNTER — Encounter: Payer: Self-pay | Admitting: *Deleted

## 2021-11-02 DIAGNOSIS — D0512 Intraductal carcinoma in situ of left breast: Secondary | ICD-10-CM | POA: Insufficient documentation

## 2021-11-02 NOTE — Telephone Encounter (Signed)
Scheduled appt per 5/8 referral. Pt is aware of appt date and time. Pt is aware to arrive 15 mins prior to appt time and to bring and updated insurance card. Pt is aware of appt location.   ?

## 2021-11-03 ENCOUNTER — Encounter: Payer: Self-pay | Admitting: *Deleted

## 2021-11-03 NOTE — Progress Notes (Signed)
?Radiation Oncology         [phone] ?________________________________ ? ?Name: Cynthia Baker        MRN: [id number]  ?Date of Service: 11/04/2021 DOB: 11-15-1955 ? ?[id number], Margaretha Sheffield, MD  Donnie Mesa, MD    ? ?REFERRING PHYSICIAN: Donnie Mesa, MD ? ? ?DIAGNOSIS: The encounter diagnosis was Ductal carcinoma in situ (DCIS) of left breast. ? ? ?HISTORY OF PRESENT ILLNESS: Cynthia Baker is a 66 y.o. female seen in the multidisciplinary breast clinic for a new diagnosis of left breast cancer. The patient was noted to have screening detected calcifications in the left breast.  Further diagnostic mammogram showed a group of calcifications measuring up to 10 mm in the posterior medial aspect of the left breast.  She underwent stereotactic biopsy on 10/15/2021 which showed intermediate grade DCIS with calcifications and necrosis.  Her cancer was ER/PR positive.  She is scheduled to undergo left lumpectomy with Dr. Georgette Dover on 11/05/2021.  She is seen today to discuss adjuvant radiotherapy.   ? ? ? ?PREVIOUS RADIATION THERAPY: No ? ? ?PAST MEDICAL HISTORY:  ?Past Medical History:  ?Diagnosis Date  ? Breast cancer ([place]) 10/15/2021  ? Diabetes mellitus without complication ([place])   ? type 2  ? Hypertension   ?   ? ? ?PAST SURGICAL HISTORY: ?Past Surgical History:  ?Procedure Laterality Date  ? BREAST BIOPSY Right 2013  ? DERMOID CYST  EXCISION Right 1994  ? DILATION AND CURETTAGE OF UTERUS    ? ? ? ?FAMILY HISTORY:  ?Family History  ?Problem Relation Age of Onset  ? Breast cancer Sister 30  ? ? ? ?SOCIAL HISTORY:  reports that she quit smoking about 33 years ago. Her smoking use included cigarettes. She has never used smokeless tobacco. She reports current alcohol use of about 2.0 standard drinks per week. She reports that she does not use drugs.  The patient is married and lives in [place].  She works for [employer] and [employer].  She enjoys gardening swimming and  reading. ? ? ?ALLERGIES: Bee venom, Ace inhibitors, Metformin, and Nsaids ? ? ?MEDICATIONS:  ?Current Outpatient Medications  ?Medication Sig Dispense Refill  ? atorvastatin (LIPITOR) 40 MG tablet Take 40 mg by mouth daily.    ? cholecalciferol (VITAMIN D) 25 MCG (1000 UNIT) tablet Take 1,000 Units by mouth daily.    ? losartan-hydrochlorothiazide (HYZAAR) 100-12.5 MG tablet Take 1 tablet by mouth daily.    ? Probiotic Product (PROBIOTIC PO) Take 1 tablet by mouth daily.    ? TRIJARDY XR 5-2.10-998 MG TB24 Take 2 tablets by mouth daily.    ? ?No current facility-administered medications for this encounter.  ? ? ? ?REVIEW OF SYSTEMS: On review of systems, the patient reports that she is doing pretty well overall.  No breast specific complaints are verbalized.   ? ?  ? ?PHYSICAL EXAM:  ?Wt Readings from Last 3 Encounters:  ?11/04/21 204 lb 14.4 oz (92.9 kg)  ?11/04/21 205 lb 3.2 oz (93.1 kg)  ? ?Temp Readings from Last 3 Encounters:  ?11/04/21 98 ?F (36.7 ?C) (Oral)  ?11/04/21 (!) 97.5 ?F (36.4 ?C)  ? ?BP Readings from Last 3 Encounters:  ?11/04/21 140/80  ?11/04/21 (!) 130/91  ? ?Pulse Readings from Last 3 Encounters:  ?11/04/21 86  ?11/04/21 74  ? ? ?In general this is a well appearing Caucasian female in no acute distress. She's alert and oriented x4 and appropriate throughout the examination. Cardiopulmonary assessment is negative  for acute distress and she exhibits normal effort. Bilateral breast exam is deferred. ? ? ? ?ECOG = 1 ? ?0 - Asymptomatic (Fully active, able to carry on all predisease activities without restriction) ? ?1 - Symptomatic but completely ambulatory (Restricted in physically strenuous activity but ambulatory and able to carry out work of a light or sedentary nature. For example, light housework, office work) ? ?2 - Symptomatic, <50% in bed during the day (Ambulatory and capable of all self care but unable to carry out any work activities. Up and about more than 50% of waking hours) ? ?3 -  Symptomatic, >50% in bed, but not bedbound (Capable of only limited self-care, confined to bed or chair 50% or more of waking hours) ? ?4 - Bedbound (Completely disabled. Cannot carry on any self-care. Totally confined to bed or chair) ? ?5 - Death ? ? Oken MM, Creech RH, Tormey DC, et al. [phone]). "Toxicity and response criteria of the [facility]". Tabiona Oncol. 5 (6): 649-55 ? ? ? ?LABORATORY DATA:  ?Lab Results  ?Component Value Date  ? WBC 5.5 11/04/2021  ? HGB 14.7 11/04/2021  ? HCT 43.8 11/04/2021  ? MCV 87.6 11/04/2021  ? PLT 250 11/04/2021  ? ?No results found for: NA, K, CL, CO2 ?No results found for: ALT, AST, GGT, ALKPHOS, BILITOT ?  ? ?RADIOGRAPHY: MM CLIP PLACEMENT LEFT ? ?Addendum Date: 10/23/2021   ?ADDENDUM REPORT: 10/23/2021 15:14 ADDENDUM: Pathology revealed INTERMEDIATE GRADE DUCTAL CARCINOMA IN SITU WITH CALCIFICATIONS AND NECROSIS of the LEFT breast, inner at posterior depth, (coil clip). This was found to be concordant by Dr. Peggye Fothergill. Pathology results were discussed with the patient by telephone by Stacie Acres, RN Nurse Navigator. The patient reported doing well after the biopsy with tenderness at the site. Post biopsy instructions and care were reviewed and questions were answered. The patient was encouraged to call [facility] for any additional concerns. Surgical consultation has been arranged with Dr. Donnie Mesa at [facility] on Oct 30, 2021. Pathology results reported by Terie Purser, RN on 10/23/2021. Electronically Signed   By: Evangeline Dakin M.D.   On: 10/23/2021 15:14  ? ?Result Date: 10/23/2021 ?CLINICAL DATA:  66 year old with screening detected indeterminate 1.0 cm group of calcifications involving the inner LEFT breast at posterior depth. EXAM: LEFT BREAST STEREOTACTIC TOMOSYNTHESIS CORE NEEDLE BIOPSY 2D and 3D DIAGNOSTIC LEFT MAMMOGRAM POST STEREOTACTIC BIOPSY COMPARISON:  Previous exams. FINDINGS: The  patient and I discussed the procedure of stereotactic-guided biopsy including benefits and alternatives. We discussed the high likelihood of a successful procedure. We discussed the risks of the procedure including infection, bleeding, tissue injury, clip migration, and inadequate sampling. Informed written consent was given. The usual time out protocol was performed immediately prior to the procedure. Lesion quadrant: Inner breast, near 9 o'clock location. Using sterile technique with chlorhexidine as skin antisepsis, 1% lidocaine and 2% lidocaine with epinephrine as local anesthetic, under stereotactic tomosynthesis guidance, a 9 Brevera gauge vacuum assisted device was used to perform core needle biopsy of calcifications involving the inner LEFT breast using a superior approach. Specimen radiograph was performed showing calcifications in at least 3 of the 5 core samples. Specimens with calcifications are identified for pathology. At the conclusion of the procedure, a coil shaped tissue marker clip was deployed into the biopsy cavity. Follow-up 2D and 3D full field CC and mediolateral mammographic images were obtained to confirm clip placement. The coil shaped tissue  marker clip is appropriately positioned at the site of the biopsied calcifications in the inner breast at posterior depth. There are a few residual calcifications at the biopsy site. Expected biopsy changes are present without evidence of hematoma. IMPRESSION: 1. Stereotactic tomosynthesis core needle biopsy of calcifications involving the inner LEFT breast at posterior depth. No apparent complications. 2. Appropriate positioning of the coil shaped tissue marking clip at the site of the biopsied calcifications. Electronically Signed: By: Evangeline Dakin M.D. On: 10/15/2021 08:24  ? ?MM LT BREAST BX W LOC DEV 1ST LESION IMAGE BX SPEC STEREO GUIDE ? ?Addendum Date: 10/23/2021   ?ADDENDUM REPORT: 10/23/2021 15:14 ADDENDUM: Pathology revealed INTERMEDIATE  GRADE DUCTAL CARCINOMA IN SITU WITH CALCIFICATIONS AND NECROSIS of the LEFT breast, inner at posterior depth, (coil clip). This was found to be concordant by Dr. Peggye Fothergill. Pathology results were discu

## 2021-11-03 NOTE — Progress Notes (Signed)
New Breast Cancer Diagnosis: Left Breast- ? ?Did patient present with symptoms (if so, please note symptoms) or screening mammography?:Screening Calcifications in the left posterior medial breast.  ? ?Location and Extent of disease :left breast. Located in the left posterior medial breat, the calcifications measured 0.2 cm in greatest linear extent. Adenopathy no. ? ?Histology per Pathology Report: grade Intermediate, DCIS 10/15/2021 ? ?Receptor Status: ER(positive), PR (positive), Her2-neu (), Ki-(%) ? ?Surgeon and surgical plan, if any: ?Dr. Tsuei 10/30/2021 ?- Ambulatory Referral to Oncology-Medical ?- Ambulatory Referral to Radiation Oncology ?- Ambulatory Referral to Genetics ?-Left Breast Lumpectomy with radioactive seed localization 11/05/2021 ? ? ?Medical oncologist, treatment if any:   ?Dr. Iruku 11/13/2021 ? ? ?Family History of Breast/Ovarian/Prostate Cancer: Sister had breast cancer (1995) mucinous carcinoma treated for 6 months with oral chemo and 33 fractions of radiation therapy. ? ?Lymphedema issues, if any:  No   ? ?Pain issues, if any: No    ? ?SAFETY ISSUES: ?Prior radiation? No ?Pacemaker/ICD? No ?Possible current pregnancy? Postmenopausal ?Is the patient on methotrexate? No ? ?Current Complaints / other details:   ?-Sister had Genetics BRCA and was negative. ?

## 2021-11-03 NOTE — Pre-Procedure Instructions (Signed)
Surgical Instructions ? ? ? Your procedure is scheduled on Thursday 11/05/21. ? ? Report to [facility] Main Entrance "A" at 07:30 A.M., then check in with the Admitting office. ? Call this number if you have problems the morning of surgery: ? [phone] ? ? If you have any questions prior to your surgery date call [phone]: Open Monday-Friday 8am-4pm ? ? ? Remember: ? Do not eat after midnight the night before your surgery ? ?You may drink clear liquids until 06:30 A.M. the morning of your surgery.   ?Clear liquids allowed are: Water, Non-Citrus Juices (without pulp), Carbonated Beverages, Clear Tea, Black Coffee ONLY (NO MILK, CREAM OR POWDERED CREAMER of any kind), and Gatorade ? ?Patient Instructions ? ?The night before surgery:  ?No food after midnight. ONLY clear liquids after midnight ? ?The day of surgery (if you have diabetes): ?Drink ONE (1) 12 oz G2 given to you in your pre admission testing appointment by 06:30 A.M. the morning of surgery. Drink in one sitting. Do not sip.  ?This drink was given to you during your hospital  ?pre-op appointment visit.  ?Nothing else to drink after completing the  ?12 oz bottle of G2. ? ?       If you have questions, please contact your surgeon?s office. ? ?  ? Take these medicines the morning of surgery with A SIP OF WATER:  ? atorvastatin (LIPITOR)  ? ?As of today, STOP taking any Aspirin (unless otherwise instructed by your surgeon) Aleve, Naproxen, Ibuprofen, Motrin, Advil, Goody's, BC's, all herbal medications, fish oil, and all vitamins. ? ?WHAT DO I DO ABOUT MY DIABETES MEDICATION? ? ? ?Do not take oral diabetes medicines (pills) the morning of surgery. ? ?DO NOT TAKE TRIJARDY XR the day before surgery or the morning of surgery.  ? ?The day of surgery, do not take other diabetes injectables, including Byetta (exenatide), Bydureon (exenatide ER), Victoza (liraglutide), or Trulicity (dulaglutide). ? ? ?HOW TO MANAGE YOUR DIABETES ?BEFORE AND AFTER SURGERY ? ?Why  is it important to control my blood sugar before and after surgery? ?Improving blood sugar levels before and after surgery helps healing and can limit problems. ?A way of improving blood sugar control is eating a healthy diet by: ? Eating less sugar and carbohydrates ? Increasing activity/exercise ? Talking with your doctor about reaching your blood sugar goals ?High blood sugars (greater than 180 mg/dL) can raise your risk of infections and slow your recovery, so you will need to focus on controlling your diabetes during the weeks before surgery. ?Make sure that the doctor who takes care of your diabetes knows about your planned surgery including the date and location. ? ?How do I manage my blood sugar before surgery? ?Check your blood sugar at least 4 times a day, starting 2 days before surgery, to make sure that the level is not too high or low. ? ?Check your blood sugar the morning of your surgery when you wake up and every 2 hours until you get to the Short Stay unit. ? ?If your blood sugar is less than 70 mg/dL, you will need to treat for low blood sugar: ?Do not take insulin. ?Treat a low blood sugar (less than 70 mg/dL) with ? cup of clear juice (cranberry or apple), 4 glucose tablets, OR glucose gel. ?Recheck blood sugar in 15 minutes after treatment (to make sure it is greater than 70 mg/dL). If your blood sugar is not greater than 70 mg/dL on recheck, call [phone] for further  instructions. ?Report your blood sugar to the short stay nurse when you get to Short Stay. ? ?If you are admitted to the hospital after surgery: ?Your blood sugar will be checked by the staff and you will probably be given insulin after surgery (instead of oral diabetes medicines) to make sure you have good blood sugar levels. ?The goal for blood sugar control after surgery is 80-180 mg/dL.  ? ?         ?Do not wear jewelry or makeup ?Do not wear lotions, powders, perfumes/colognes, or deodorant. ?Do not shave 48 hours prior to  surgery.  Men may shave face and neck. ?Do not bring valuables to the hospital. ?Do not wear nail polish, gel polish, artificial nails, or any other type of covering on natural nails (fingers and toes) ?If you have artificial nails or gel coating that need to be removed by a nail salon, please have this removed prior to surgery. Artificial nails or gel coating may interfere with anesthesia's ability to adequately monitor your vital signs. ? ?[facility] is not responsible for any belongings or valuables. .  ? ?Do NOT Smoke (Tobacco/Vaping)  24 hours prior to your procedure ? ?If you use a CPAP at night, you may bring your mask for your overnight stay. ?  ?Contacts, glasses, hearing aids, dentures or partials may not be worn into surgery, please bring cases for these belongings ?  ?For patients admitted to the hospital, discharge time will be determined by your treatment team. ?  ?Patients discharged the day of surgery will not be allowed to drive home, and someone needs to stay with them for 24 hours. ? ? ?SURGICAL WAITING ROOM VISITATION ?Patients having surgery or a procedure in a hospital may have two support people. ?Children under the age of 74 must have an adult with them who is not the patient. ?They may stay in the waiting area during the procedure and may switch out with other visitors. If the patient needs to stay at the hospital during part of their recovery, the visitor guidelines for inpatient rooms apply. ? ?Please refer to the [facility] website for the visitor guidelines for Inpatients (after your surgery is over and you are in a regular room).  ? ? ? ? ? ?Special instructions:   ? ?Oral Hygiene is also important to reduce your risk of infection.  Remember - BRUSH YOUR TEETH THE MORNING OF SURGERY WITH YOUR REGULAR TOOTHPASTE ? ? ?[facility]- Preparing For Surgery ? ?Before surgery, you can play an important role. Because skin is not sterile, your skin needs to be as free of germs as possible. You  can reduce the number of germs on your skin by washing with CHG (chlorahexidine gluconate) Soap before surgery.  CHG is an antiseptic cleaner which kills germs and bonds with the skin to continue killing germs even after washing.   ? ? ?Please do not use if you have an allergy to CHG or antibacterial soaps. If your skin becomes reddened/irritated stop using the CHG.  ?Do not shave (including legs and underarms) for at least 48 hours prior to first CHG shower. It is OK to shave your face. ? ?Please follow these instructions carefully. ?  ? ? Shower the NIGHT BEFORE SURGERY and the MORNING OF SURGERY with CHG Soap.  ? If you chose to wash your hair, wash your hair first as usual with your normal shampoo. After you shampoo, rinse your hair and body thoroughly to remove the shampoo.  Then [employer] and genitals (private parts) with your normal soap and rinse thoroughly to remove soap. ? ?After that Use CHG Soap as you would any other liquid soap. You can apply CHG directly to the skin and wash gently with a scrungie or a clean washcloth.  ? ?Apply the CHG Soap to your body ONLY FROM THE NECK DOWN.  Do not use on open wounds or open sores. Avoid contact with your eyes, ears, mouth and genitals (private parts). Wash Face and genitals (private parts)  with your normal soap.  ? ?Wash thoroughly, paying special attention to the area where your surgery will be performed. ? ?Thoroughly rinse your body with warm water from the neck down. ? ?DO NOT shower/wash with your normal soap after using and rinsing off the CHG Soap. ? ?Pat yourself dry with a CLEAN TOWEL. ? ?Wear CLEAN PAJAMAS to bed the night before surgery ? ?Place CLEAN SHEETS on your bed the night before your surgery ? ?DO NOT SLEEP WITH PETS. ? ? ?Day of Surgery: ? ?Take a shower with CHG soap. ?Wear Clean/Comfortable clothing the morning of surgery ?Do not apply any deodorants/lotions.   ?Remember to brush your teeth WITH YOUR REGULAR TOOTHPASTE. ? ? ? ?If you  received a COVID test during your pre-op visit, it is requested that you wear a mask when out in public, stay away from anyone that may not be feeling well, and notify your surgeon if you develop symptoms. I

## 2021-11-04 ENCOUNTER — Encounter (HOSPITAL_COMMUNITY): Payer: Self-pay

## 2021-11-04 ENCOUNTER — Ambulatory Visit
Admission: RE | Admit: 2021-11-04 | Discharge: 2021-11-04 | Disposition: A | Discharge status: Home / Self Care | Payer: 59 | Source: Ambulatory Visit | Attending: Radiation Oncology | Admitting: Radiation Oncology

## 2021-11-04 ENCOUNTER — Encounter (HOSPITAL_COMMUNITY)
Admission: RE | Admit: 2021-11-04 | Discharge: 2021-11-04 | Disposition: A | Discharge status: Home / Self Care | Payer: 59 | Source: Ambulatory Visit | Attending: Surgery | Admitting: Surgery

## 2021-11-04 ENCOUNTER — Other Ambulatory Visit: Payer: Self-pay

## 2021-11-04 ENCOUNTER — Ambulatory Visit
Admission: RE | Admit: 2021-11-04 | Discharge: 2021-11-04 | Disposition: A | Discharge status: Home / Self Care | Payer: 59 | Source: Ambulatory Visit | Attending: Surgery | Admitting: Surgery

## 2021-11-04 ENCOUNTER — Encounter: Payer: Self-pay | Admitting: Radiation Oncology

## 2021-11-04 VITALS — BP 130/91 | HR 74 | Temp 97.5°F | Resp 18 | Ht 69.5 in | Wt 205.2 lb

## 2021-11-04 VITALS — BP 140/80 | HR 86 | Temp 98.0°F | Resp 17 | Ht 69.0 in | Wt 204.9 lb

## 2021-11-04 DIAGNOSIS — Z01818 Encounter for other preprocedural examination: Secondary | ICD-10-CM | POA: Diagnosis not present

## 2021-11-04 DIAGNOSIS — D0512 Intraductal carcinoma in situ of left breast: Secondary | ICD-10-CM

## 2021-11-04 DIAGNOSIS — Z170 Estrogen receptor positive status [ER+]: Secondary | ICD-10-CM | POA: Insufficient documentation

## 2021-11-04 DIAGNOSIS — I10 Essential (primary) hypertension: Secondary | ICD-10-CM | POA: Diagnosis not present

## 2021-11-04 DIAGNOSIS — Z87891 Personal history of nicotine dependence: Secondary | ICD-10-CM | POA: Diagnosis not present

## 2021-11-04 DIAGNOSIS — Z79899 Other long term (current) drug therapy: Secondary | ICD-10-CM | POA: Diagnosis not present

## 2021-11-04 DIAGNOSIS — Z853 Personal history of malignant neoplasm of breast: Secondary | ICD-10-CM | POA: Insufficient documentation

## 2021-11-04 DIAGNOSIS — E119 Type 2 diabetes mellitus without complications: Secondary | ICD-10-CM | POA: Diagnosis not present

## 2021-11-04 HISTORY — DX: Type 2 diabetes mellitus without complications: E11.9

## 2021-11-04 HISTORY — DX: Essential (primary) hypertension: I10

## 2021-11-04 LAB — BASIC METABOLIC PANEL
Anion gap: 7 (ref 5–15)
BUN: 15 mg/dL (ref 8–23)
CO2: 27 mmol/L (ref 22–32)
Calcium: 9.9 mg/dL (ref 8.9–10.3)
Chloride: 105 mmol/L (ref 98–111)
Creatinine, Ser: 0.9 mg/dL (ref 0.44–1.00)
GFR, Estimated: >60 mL/min (ref >60)
Glucose, Bld: 124 mg/dL — ABNORMAL HIGH (ref 70–99)
Potassium: 3.9 mmol/L (ref 3.5–5.1)
Sodium: 139 mmol/L (ref 135–145)

## 2021-11-04 LAB — CBC
HCT: 43.8 % (ref 36.0–46.0)
Hemoglobin: 14.7 g/dL (ref 12.0–15.0)
MCH: 29.4 pg (ref 26.0–34.0)
MCHC: 33.6 g/dL (ref 30.0–36.0)
MCV: 87.6 fL (ref 80.0–100.0)
Platelets: 250 10*3/uL (ref 150–400)
RBC: 5 MIL/uL (ref 3.87–5.11)
RDW: 12.5 % (ref 11.5–15.5)
WBC: 5.5 10*3/uL (ref 4.0–10.5)
nRBC: 0 % (ref 0.0–0.2)

## 2021-11-04 LAB — GLUCOSE, CAPILLARY: Glucose-Capillary: 163 mg/dL — ABNORMAL HIGH (ref 70–99)

## 2021-11-04 NOTE — Progress Notes (Signed)
Anesthesia Chart Review: ? Case: [id number] Date/Time: 11/05/21 0915  ? Procedure: LEFT BREAST LUMPECTOMY WITH RADIOACTIVE SEED LOCALIZATION (Left: Breast)  ? Anesthesia type: General  ? Pre-op diagnosis: LEFT BREAST DCIS  ? Location: [facility] ROOM 07 / [facility] OR  ? Surgeons: Donnie Mesa, MD  ? ?  ? ? ?DISCUSSION: Patient is a 66 year old female scheduled for the above procedure.  Both PAT and radioactive seed localization were scheduled on 11/04/2021. ? 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 ? ?Anesthesia team to evaluate on the day of surgery. ? ?VS: BP 140/80 Comment: manual  Pulse 86   Temp 36.7 ?C (Oral)   Resp 17   Ht '5\' 9"'$  (1.753 m)   Wt 92.9 kg   SpO2 96%   BMI 30.26 kg/m?  ? ? ?PROVIDERS: ?Kelton Pillar, MD is PCP  ?Kyung Rudd, MD is RAD-ONC ? ? ?LABS: Labs reviewed: Acceptable for surgery. A1c 7.3% 10/20/21 ([facility]). ?(all labs ordered are listed, but only abnormal results are displayed) ? ?Labs Reviewed  ?GLUCOSE, CAPILLARY - Abnormal; Notable for the following components:  ?    Result Value  ? Glucose-Capillary 163 (*)   ? All other components within normal limits  ?BASIC METABOLIC PANEL - Abnormal; Notable for the following components:  ? Glucose, Bld 124 (*)   ? All other components within normal limits  ?CBC  ? ? ?EKG: 11/04/21: NSR.  Isolated Q-wave III.  ? ? ?CV: N/A ? ?Past Medical History:  ?Diagnosis Date  ? Breast cancer ([place]) 10/15/2021  ? Diabetes mellitus without complication ([place])   ? type 2  ? Hypertension   ? ? ?Past Surgical History:  ?Procedure Laterality Date  ? BREAST BIOPSY Right 2013  ? DERMOID CYST  EXCISION Right 1994  ? DILATION AND CURETTAGE OF UTERUS    ? ? ?MEDICATIONS: ? atorvastatin (LIPITOR) 40 MG tablet  ? cholecalciferol (VITAMIN D) 25 MCG (1000 UNIT) tablet  ? losartan-hydrochlorothiazide (HYZAAR) 100-12.5 MG tablet  ? Probiotic Product (PROBIOTIC PO)  ? TRIJARDY XR 5-2.10-998 MG TB24  ? ?No current facility-administered  medications for this encounter.  ? ? ?Myra Gianotti, PA-C ?Surgical Short Stay/Anesthesiology ?[facility] Phone [phone] ?[facility] Phone [phone] ?11/04/2021 4:05 PM ?   ? ? ? ? ? ?

## 2021-11-04 NOTE — Addendum Note (Signed)
Encounter addended by: Hayden Pedro, PA-C on: 11/04/2021 11:44 AM ? Actions taken: Level of Service modified

## 2021-11-04 NOTE — Progress Notes (Addendum)
PCP - Kelton Pillar ? ? ?PPM/ICD - denies ? ? ?Chest x-ray - n/a ?EKG - 11/04/21 ?Stress Test - denies ?ECHO - denies ?Cardiac Cath - denies ? ?Sleep Study - denies ? ? ?Fasting Blood Sugar - 160 ?Checks Blood Sugar couple times  week ? ?As of today, STOP taking any Aspirin (unless otherwise instructed by your surgeon) Aleve, Naproxen, Ibuprofen, Motrin, Advil, Goody's, BC's, all herbal medications, fish oil, and all vitamins. ? ?ERAS Protcol -yes ?PRE-SURGERY Ensure or G2- g2 given ? ?COVID TEST- not needed ? ? ?Anesthesia review: yes, seed placement. Patient pulled up labs on her phone from her recent appt with PCP on 10/20/2021. A1C was 7.3. no need to repeat at PAT appt.  ? ?Patient denies shortness of breath, fever, cough and chest pain at PAT appointment ? ? ?All instructions explained to the patient, with a verbal understanding of the material. Patient agrees to go over the instructions while at home for a better understanding. Patient also instructed to self quarantine after being tested for COVID-19. The opportunity to ask questions was provided. ? ? ?

## 2021-11-04 NOTE — Anesthesia Preprocedure Evaluation (Addendum)
Anesthesia Evaluation  ?Patient identified by MRN, date of birth, ID band ?Patient awake ? ? ? ?Reviewed: ?Allergy & Precautions, NPO status , Patient's Chart, lab work & pertinent test results ? ?History of Anesthesia Complications ?Negative for: history of anesthetic complications ? ?Airway ?Mallampati: II ? ?TM Distance: >3 FB ?Neck ROM: Full ? ? ? Dental ? ?(+) Dental Advisory Given, Teeth Intact ?  ?Pulmonary ?neg pulmonary ROS, former smoker,  ?  ?Pulmonary exam normal ? ? ? ? ? ? ? Cardiovascular ?hypertension, Pt. on medications ?Normal cardiovascular exam ? ? ?  ?Neuro/Psych ?negative neurological ROS ?   ? GI/Hepatic ?negative GI ROS, Neg liver ROS,   ?Endo/Other  ?diabetes, Type 2 ? Renal/GU ?negative Renal ROS  ?negative genitourinary ?  ?Musculoskeletal ?negative musculoskeletal ROS ?(+)  ? Abdominal ?  ?Peds ? Hematology ?negative hematology ROS ?(+)   ?Anesthesia Other Findings ? ?Breast cancer ? Reproductive/Obstetrics ? ?  ? ? ? ? ? ? ? ? ? ? ? ? ? ?  ?  ? ? ? ? ? ? ?Anesthesia Physical ?Anesthesia Plan ? ?ASA: 2 ? ?Anesthesia Plan: General  ? ?Post-op Pain Management: Tylenol PO (pre-op)* and Toradol IV (intra-op)*  ? ?Induction: Intravenous ? ?PONV Risk Score and Plan: 3 and Ondansetron, Dexamethasone, Midazolam and Treatment may vary due to age or medical condition ? ?Airway Management Planned: LMA ? ?Additional Equipment: None ? ?Intra-op Plan:  ? ?Post-operative Plan: Extubation in OR ? ?Informed Consent: I have reviewed the patients History and Physical, chart, labs and discussed the procedure including the risks, benefits and alternatives for the proposed anesthesia with the patient or authorized representative who has indicated his/her understanding and acceptance.  ? ? ? ?Dental advisory given ? ?Plan Discussed with:  ? ?Anesthesia Plan Comments: (PAT note written 11/04/2021 by Myra Gianotti, PA-C. ?)  ? ? ? ? ? ?Anesthesia Quick Evaluation ? ?

## 2021-11-05 ENCOUNTER — Ambulatory Visit (HOSPITAL_COMMUNITY)
Admission: RE | Admit: 2021-11-05 | Discharge: 2021-11-05 | Disposition: A | Discharge status: Home / Self Care | Payer: 59 | Source: Ambulatory Visit | Attending: Surgery | Admitting: Surgery

## 2021-11-05 ENCOUNTER — Ambulatory Visit (HOSPITAL_COMMUNITY): Payer: 59 | Admitting: Vascular Surgery

## 2021-11-05 ENCOUNTER — Other Ambulatory Visit: Payer: Self-pay

## 2021-11-05 ENCOUNTER — Encounter (HOSPITAL_COMMUNITY)
Admission: RE | Disposition: A | Discharge status: Home / Self Care | Payer: Self-pay | Source: Ambulatory Visit | Attending: Surgery

## 2021-11-05 ENCOUNTER — Encounter (HOSPITAL_COMMUNITY): Payer: Self-pay | Admitting: Surgery

## 2021-11-05 ENCOUNTER — Ambulatory Visit (HOSPITAL_BASED_OUTPATIENT_CLINIC_OR_DEPARTMENT_OTHER): Payer: 59 | Admitting: Anesthesiology

## 2021-11-05 ENCOUNTER — Ambulatory Visit
Admission: RE | Admit: 2021-11-05 | Discharge: 2021-11-05 | Disposition: A | Discharge status: Home / Self Care | Payer: 59 | Source: Ambulatory Visit | Attending: Surgery | Admitting: Surgery

## 2021-11-05 DIAGNOSIS — Z853 Personal history of malignant neoplasm of breast: Secondary | ICD-10-CM | POA: Insufficient documentation

## 2021-11-05 DIAGNOSIS — E119 Type 2 diabetes mellitus without complications: Secondary | ICD-10-CM | POA: Diagnosis not present

## 2021-11-05 DIAGNOSIS — Z87891 Personal history of nicotine dependence: Secondary | ICD-10-CM | POA: Diagnosis not present

## 2021-11-05 DIAGNOSIS — D0512 Intraductal carcinoma in situ of left breast: Secondary | ICD-10-CM

## 2021-11-05 DIAGNOSIS — E7800 Pure hypercholesterolemia, unspecified: Secondary | ICD-10-CM | POA: Diagnosis not present

## 2021-11-05 DIAGNOSIS — Z803 Family history of malignant neoplasm of breast: Secondary | ICD-10-CM | POA: Insufficient documentation

## 2021-11-05 DIAGNOSIS — I10 Essential (primary) hypertension: Secondary | ICD-10-CM | POA: Insufficient documentation

## 2021-11-05 DIAGNOSIS — Z170 Estrogen receptor positive status [ER+]: Secondary | ICD-10-CM | POA: Diagnosis not present

## 2021-11-05 HISTORY — PX: BREAST LUMPECTOMY WITH RADIOACTIVE SEED LOCALIZATION: SHX6424

## 2021-11-05 LAB — GLUCOSE, CAPILLARY
Glucose-Capillary: 175 mg/dL — ABNORMAL HIGH (ref 70–99)
Glucose-Capillary: 123 mg/dL — ABNORMAL HIGH (ref 70–99)

## 2021-11-05 SURGERY — BREAST LUMPECTOMY WITH RADIOACTIVE SEED LOCALIZATION
Anesthesia: General | Site: Breast | Laterality: Left

## 2021-11-05 MED ORDER — BUPIVACAINE-EPINEPHRINE 0.25% -1:200000 IJ SOLN
INTRAMUSCULAR | Status: DC | PRN
  Administered 2021-11-05: 10 mL

## 2021-11-05 MED ORDER — ONDANSETRON HCL 4 MG/2ML IJ SOLN: 4.0000 mg | Freq: Once | INTRAMUSCULAR | Status: DC | PRN

## 2021-11-05 MED ORDER — ROCURONIUM BROMIDE 10 MG/ML (PF) SYRINGE
PREFILLED_SYRINGE | INTRAVENOUS | Status: AC
  Filled 2021-11-05: qty 10

## 2021-11-05 MED ORDER — KETOROLAC TROMETHAMINE 30 MG/ML IJ SOLN
INTRAMUSCULAR | Status: AC
  Filled 2021-11-05: qty 1

## 2021-11-05 MED ORDER — DEXAMETHASONE SODIUM PHOSPHATE 10 MG/ML IJ SOLN
INTRAMUSCULAR | Status: DC | PRN
Start: 2021-11-05 — End: 2021-11-05
  Administered 2021-11-05: 8 mg via INTRAVENOUS

## 2021-11-05 MED ORDER — ONDANSETRON HCL 4 MG/2ML IJ SOLN
INTRAMUSCULAR | Status: DC | PRN
  Administered 2021-11-05: 4 mg via INTRAVENOUS

## 2021-11-05 MED ORDER — CHLORHEXIDINE GLUCONATE CLOTH 2 % EX PADS: 6.0000 | MEDICATED_PAD | Freq: Once | CUTANEOUS | Status: DC

## 2021-11-05 MED ORDER — 0.9 % SODIUM CHLORIDE (POUR BTL) OPTIME
TOPICAL | Status: DC | PRN
  Administered 2021-11-05: 1000 mL

## 2021-11-05 MED ORDER — OXYCODONE HCL 5 MG PO TABS: 5.0000 mg | ORAL_TABLET | Freq: Once | ORAL | Status: DC | PRN

## 2021-11-05 MED ORDER — LIDOCAINE 2% (20 MG/ML) 5 ML SYRINGE
INTRAMUSCULAR | Status: DC | PRN
  Administered 2021-11-05: 100 mg via INTRAVENOUS

## 2021-11-05 MED ORDER — FENTANYL CITRATE (PF) 250 MCG/5ML IJ SOLN
INTRAMUSCULAR | Status: DC | PRN
  Administered 2021-11-05 (×2): 50 ug via INTRAVENOUS

## 2021-11-05 MED ORDER — INSULIN ASPART 100 UNIT/ML IJ SOLN: 0.0000 [IU] | INTRAMUSCULAR | Status: DC | PRN

## 2021-11-05 MED ORDER — CEFAZOLIN SODIUM-DEXTROSE 2-4 GM/100ML-% IV SOLN
2.0000 g | INTRAVENOUS | Status: AC
  Administered 2021-11-05: 2 g via INTRAVENOUS
  Filled 2021-11-05: qty 100

## 2021-11-05 MED ORDER — FENTANYL CITRATE (PF) 100 MCG/2ML IJ SOLN: 25.0000 ug | INTRAMUSCULAR | Status: DC | PRN

## 2021-11-05 MED ORDER — ACETAMINOPHEN 500 MG PO TABS
1000.0000 mg | ORAL_TABLET | ORAL | Status: DC
Start: 2021-11-05 — End: 2021-11-05
  Filled 2021-11-05: qty 2

## 2021-11-05 MED ORDER — DEXAMETHASONE SODIUM PHOSPHATE 10 MG/ML IJ SOLN
INTRAMUSCULAR | Status: AC
  Filled 2021-11-05: qty 3

## 2021-11-05 MED ORDER — BUPIVACAINE-EPINEPHRINE (PF) 0.25% -1:200000 IJ SOLN
INTRAMUSCULAR | Status: AC
  Filled 2021-11-05: qty 30

## 2021-11-05 MED ORDER — LACTATED RINGERS IV SOLN: INTRAVENOUS | Status: DC

## 2021-11-05 MED ORDER — MIDAZOLAM HCL 2 MG/2ML IJ SOLN
INTRAMUSCULAR | Status: DC | PRN
Start: 2021-11-05 — End: 2021-11-05
  Administered 2021-11-05: 2 mg via INTRAVENOUS

## 2021-11-05 MED ORDER — LIDOCAINE 2% (20 MG/ML) 5 ML SYRINGE
INTRAMUSCULAR | Status: AC
  Filled 2021-11-05: qty 15

## 2021-11-05 MED ORDER — MIDAZOLAM HCL 2 MG/2ML IJ SOLN
INTRAMUSCULAR | Status: AC
  Filled 2021-11-05: qty 2

## 2021-11-05 MED ORDER — INSULIN ASPART 100 UNIT/ML IJ SOLN
INTRAMUSCULAR | Status: AC
  Administered 2021-11-05: 2 [IU] via SUBCUTANEOUS
  Filled 2021-11-05: qty 1

## 2021-11-05 MED ORDER — FENTANYL CITRATE (PF) 250 MCG/5ML IJ SOLN
INTRAMUSCULAR | Status: AC
  Filled 2021-11-05: qty 5

## 2021-11-05 MED ORDER — PROPOFOL 500 MG/50ML IV EMUL
INTRAVENOUS | Status: DC | PRN
  Administered 2021-11-05: 50 ug/kg/min via INTRAVENOUS

## 2021-11-05 MED ORDER — PROPOFOL 10 MG/ML IV BOLUS
INTRAVENOUS | Status: DC | PRN
  Administered 2021-11-05: 150 mg via INTRAVENOUS

## 2021-11-05 MED ORDER — OXYCODONE HCL 5 MG/5ML PO SOLN: 5.0000 mg | Freq: Once | ORAL | Status: DC | PRN

## 2021-11-05 MED ORDER — ONDANSETRON HCL 4 MG/2ML IJ SOLN
INTRAMUSCULAR | Status: AC
  Filled 2021-11-05: qty 8

## 2021-11-05 MED ORDER — PROPOFOL 10 MG/ML IV BOLUS
INTRAVENOUS | Status: AC
  Filled 2021-11-05: qty 20

## 2021-11-05 MED ORDER — CHLORHEXIDINE GLUCONATE 0.12 % MT SOLN
15.0000 mL | Freq: Once | OROMUCOSAL | Status: AC
  Administered 2021-11-05: 15 mL via OROMUCOSAL
  Filled 2021-11-05: qty 15

## 2021-11-05 MED ORDER — ORAL CARE MOUTH RINSE: 15.0000 mL | Freq: Once | OROMUCOSAL | Status: AC

## 2021-11-05 MED ORDER — AMISULPRIDE (ANTIEMETIC) 5 MG/2ML IV SOLN: 10.0000 mg | Freq: Once | INTRAVENOUS | Status: DC | PRN

## 2021-11-05 MED ORDER — PROPOFOL 1000 MG/100ML IV EMUL
INTRAVENOUS | Status: AC
  Filled 2021-11-05: qty 100

## 2021-11-05 SURGICAL SUPPLY — 36 items
APPLIER CLIP 9.375 MED OPEN (MISCELLANEOUS) ×2
BAG COUNTER SPONGE SURGICOUNT (BAG) ×2 IMPLANT
BENZOIN TINCTURE PRP APPL 2/3 (GAUZE/BANDAGES/DRESSINGS) ×2 IMPLANT
BINDER BREAST XLRG (GAUZE/BANDAGES/DRESSINGS) ×1 IMPLANT
CANISTER SUCT 3000ML PPV (MISCELLANEOUS) ×2 IMPLANT
CHLORAPREP W/TINT 26 (MISCELLANEOUS) ×2 IMPLANT
CLIP APPLIE 9.375 MED OPEN (MISCELLANEOUS) IMPLANT
COVER PROBE W GEL 5X96 (DRAPES) ×2 IMPLANT
COVER SURGICAL LIGHT HANDLE (MISCELLANEOUS) ×2 IMPLANT
DEVICE DUBIN SPECIMEN MAMMOGRA (MISCELLANEOUS) ×2 IMPLANT
DRAPE CHEST BREAST 15X10 FENES (DRAPES) ×2 IMPLANT
DRSG TEGADERM 4X4.75 (GAUZE/BANDAGES/DRESSINGS) ×2 IMPLANT
ELECT CAUTERY BLADE 6.4 (BLADE) ×2 IMPLANT
ELECT REM PT RETURN 9FT ADLT (ELECTROSURGICAL) ×2
ELECTRODE REM PT RTRN 9FT ADLT (ELECTROSURGICAL) ×1 IMPLANT
GAUZE SPONGE 2X2 8PLY STRL LF (GAUZE/BANDAGES/DRESSINGS) ×1 IMPLANT
GLOVE BIO SURGEON STRL SZ7 (GLOVE) ×2 IMPLANT
GLOVE BIOGEL PI IND STRL 7.5 (GLOVE) ×1 IMPLANT
GLOVE BIOGEL PI INDICATOR 7.5 (GLOVE) ×1
GOWN STRL REUS W/ TWL LRG LVL3 (GOWN DISPOSABLE) ×2 IMPLANT
GOWN STRL REUS W/TWL LRG LVL3 (GOWN DISPOSABLE) ×2
KIT BASIN OR (CUSTOM PROCEDURE TRAY) ×2 IMPLANT
KIT MARKER MARGIN INK (KITS) ×2 IMPLANT
NDL HYPO 25GX1X1/2 BEV (NEEDLE) ×1 IMPLANT
NEEDLE HYPO 25GX1X1/2 BEV (NEEDLE) ×2 IMPLANT
NS IRRIG 1000ML POUR BTL (IV SOLUTION) ×2 IMPLANT
PACK GENERAL/GYN (CUSTOM PROCEDURE TRAY) ×2 IMPLANT
SPONGE GAUZE 2X2 STER 10/PKG (GAUZE/BANDAGES/DRESSINGS) ×1
SPONGE T-LAP 4X18 ~~LOC~~+RFID (SPONGE) ×2 IMPLANT
STRIP CLOSURE SKIN 1/2X4 (GAUZE/BANDAGES/DRESSINGS) ×2 IMPLANT
SUT MNCRL AB 4-0 PS2 18 (SUTURE) ×2 IMPLANT
SUT VIC AB 3-0 SH 27 (SUTURE) ×1
SUT VIC AB 3-0 SH 27X BRD (SUTURE) ×1 IMPLANT
SYR CONTROL 10ML LL (SYRINGE) ×2 IMPLANT
TOWEL GREEN STERILE (TOWEL DISPOSABLE) ×2 IMPLANT
TOWEL GREEN STERILE FF (TOWEL DISPOSABLE) ×2 IMPLANT

## 2021-11-05 NOTE — Transfer of Care (Signed)
Immediate Anesthesia Transfer of Care Note ? ?Patient: Cynthia Baker ? ?Procedure(s) Performed: LEFT BREAST LUMPECTOMY WITH RADIOACTIVE SEED LOCALIZATION (Left: Breast) ? ?Patient Location: PACU ? ?Anesthesia Type:General ? ?Level of Consciousness: awake, alert  and oriented ? ?Airway & Oxygen Therapy: Patient Spontanous Breathing ? ?Post-op Assessment: Report given to RN and Post -op Vital signs reviewed and stable ? ?Post vital signs: Reviewed and stable ? ?Last Vitals:  ?Vitals Value Taken Time  ?BP 138/71 11/05/21 1035  ?Temp    ?Pulse 69 11/05/21 1038  ?Resp 8 11/05/21 1038  ?SpO2 93 % 11/05/21 1038  ?Vitals shown include unvalidated device data. ? ?Last Pain:  ?Vitals:  ? 11/05/21 0753  ?TempSrc:   ?PainSc: 0-No pain  ?   ? ?  ? ?Complications: No notable events documented. ?

## 2021-11-05 NOTE — Discharge Instructions (Signed)
[facility] Office Phone Number [phone]  BREAST BIOPSY/ PARTIAL MASTECTOMY: POST OP INSTRUCTIONS  Always review your discharge instruction sheet given to you by the facility where your surgery was performed.  IF YOU HAVE DISABILITY OR FAMILY LEAVE FORMS, YOU MUST BRING THEM TO THE OFFICE FOR PROCESSING.  DO NOT GIVE THEM TO YOUR DOCTOR.  A prescription for pain medication may be given to you upon discharge.  Take your pain medication as prescribed, if needed.  If narcotic pain medicine is not needed, then you may take acetaminophen (Tylenol) or ibuprofen (Advil) as needed. Take your usually prescribed medications unless otherwise directed If you need a refill on your pain medication, please contact your pharmacy.  They will contact our office to request authorization.  Prescriptions will not be filled after 5pm or on week-ends. You should eat very light the first 24 hours after surgery, such as soup, crackers, pudding, etc.  Resume your normal diet the day after surgery. Most patients will experience some swelling and bruising in the breast.  Ice packs and a good support bra will help.  Swelling and bruising can take several days to resolve.  It is common to experience some constipation if taking pain medication after surgery.  Increasing fluid intake and taking a stool softener will usually help or prevent this problem from occurring.  A mild laxative (Milk of Magnesia or Miralax) should be taken according to package directions if there are no bowel movements after 48 hours. Unless discharge instructions indicate otherwise, you may remove your bandages 48 hours after surgery, and you may shower at that time.  You will have steri-strips (small skin tapes) in place directly over the incision.  These strips should be left on the skin for 7-10 days.   Any sutures or staples will be removed at the office during your follow-up visit. ACTIVITIES:  You may resume regular daily  activities (gradually increasing) beginning the next day.  Wearing a good support bra or sports bra minimizes pain and swelling.  You may have sexual intercourse when it is comfortable. You may drive when you no longer are taking prescription pain medication, you can comfortably wear a seatbelt, and you can safely maneuver your car and apply brakes. RETURN TO WORK:  1-2 weeks You should see your doctor in the office for a follow-up appointment approximately two weeks after your surgery.  Your doctor's nurse will typically make your follow-up appointment when she calls you with your pathology report.  Expect your pathology report 2-3 business days after your surgery.  You may call to check if you do not hear from us after three days. OTHER INSTRUCTIONS: _______________________________________________________________________________________________ _____________________________________________________________________________________________________________________________________ _____________________________________________________________________________________________________________________________________ _____________________________________________________________________________________________________________________________________  WHEN TO CALL YOUR DOCTOR: Fever over 101.0 Nausea and/or vomiting. Extreme swelling or bruising. Continued bleeding from incision. Increased pain, redness, or drainage from the incision.  The clinic staff is available to answer your questions during regular business hours.  Please don't hesitate to call and ask to speak to one of the nurses for clinical concerns.  If you have a medical emergency, go to the nearest emergency room or call 911.  A surgeon from [facility] is always on call at the hospital.  For further questions, please visit [email or web address]   

## 2021-11-05 NOTE — Anesthesia Postprocedure Evaluation (Signed)
Anesthesia Post Note ? ?Patient: BRIDGIT EYNON ? ?Procedure(s) Performed: LEFT BREAST LUMPECTOMY WITH RADIOACTIVE SEED LOCALIZATION (Left: Breast) ? ?  ? ?Patient location during evaluation: PACU ?Anesthesia Type: General ?Level of consciousness: awake and alert ?Pain management: pain level controlled ?Vital Signs Assessment: post-procedure vital signs reviewed and stable ?Respiratory status: spontaneous breathing, nonlabored ventilation and respiratory function stable ?Cardiovascular status: blood pressure returned to baseline and stable ?Postop Assessment: no apparent nausea or vomiting ?Anesthetic complications: no ? ? ?No notable events documented. ? ?Last Vitals:  ?Vitals:  ? 11/05/21 1051 11/05/21 1106  ?BP: (!) 133/92 (!) 153/75  ?Pulse: 70 70  ?Resp: 13 17  ?Temp:  (!) 36.2 ?C  ?SpO2: 98% 98%  ?  ?Last Pain:  ?Vitals:  ? 11/05/21 1106  ?TempSrc:   ?PainSc: 0-No pain  ? ? ?  ?  ?  ?  ?  ?  ? ?Lidia Collum ? ? ? ? ?

## 2021-11-05 NOTE — Interval H&P Note (Signed)
History and Physical Interval Note: ? ?11/05/2021 ?8:55 AM ? ?Cynthia Baker  has presented today for surgery, with the diagnosis of LEFT BREAST DCIS.  The various methods of treatment have been discussed with the patient and family. After consideration of risks, benefits and other options for treatment, the patient has consented to  Procedure(s): ?LEFT BREAST LUMPECTOMY WITH RADIOACTIVE SEED LOCALIZATION (Left) as a surgical intervention.  The patient's history has been reviewed, patient examined, no change in status, stable for surgery.  I have reviewed the patient's chart and labs.  Questions were answered to the patient's satisfaction.   ? ? ?Cynthia Baker ? ? ?

## 2021-11-05 NOTE — Op Note (Signed)
Pre-op Diagnosis:  Left breast ductal carcinoma in situ ?Post-op Diagnosis: same ?Procedure:  Left radioactive seed localized lumpectomy ?Surgeon:  Maia Petties. ?Anesthesia:  GEN - LMA ?Indications:  This is a 66 year old female with hypertension, diabetes type 2, hypercholesterolemia, who presents after a recent screening mammogram showed a cluster of calcifications in the left posterior medial breast.  She underwent stereotactic biopsy of this 1.0 x 0.4 x 0.5 cm area of calcifications.  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 ?A radioactive seed was placed near the biopsy clip yesterday by radiology. ?Description of procedure: The patient is brought to the operating room placed in supine position on the operating room table. After an adequate level of general anesthesia was obtained, her left breast was prepped with ChloraPrep and draped in sterile fashion. A timeout was taken to ensure the proper patient and proper procedure. We interrogated the breast with the neoprobe. We made a transverse incision around the medial part of the breast after infiltrating with 0.25% Marcaine. Dissection was carried down in the breast tissue with cautery. We used the neoprobe to guide [place] towards the radioactive seed. We excised an area of tissue around the radioactive seed 2 cm in diameter. The specimen was removed and was oriented with a paint kit. Specimen mammogram showed the radioactive seed as well as the biopsy clip within the specimen. This was sent for pathologic examination. There is no residual radioactivity within the biopsy cavity. We inspected carefully for hemostasis. The wound was thoroughly irrigated. The wound was closed with a deep layer of 3-0 Vicryl and a subcuticular layer of 4-0 Monocryl. Benzoin Steri-Strips were applied. The patient was then extubated and brought to the recovery room in stable condition. All sponge, instrument, and needle counts are  correct. ? ?Imogene Burn. Aiva Miskell, MD, FACS ?[facility]  ?General/ Trauma Surgery ? ?11/05/2021 ?10:31 AM ? ? ?

## 2021-11-05 NOTE — Anesthesia Procedure Notes (Signed)
Procedure Name: LMA Insertion ?Date/Time: 11/05/2021 9:50 AM ?Performed by: Minerva Ends, CRNA ?Pre-anesthesia Checklist: Patient identified, Emergency Drugs available, Suction available and Patient being monitored ?Patient Re-evaluated:Patient Re-evaluated prior to induction ?Oxygen Delivery Method: Circle system utilized ?Preoxygenation: Pre-oxygenation with 100% oxygen ?Induction Type: IV induction ?LMA: LMA inserted ?LMA Size: 4.0 ?Tube type: Oral ?Number of attempts: 1 ?Placement Confirmation: positive ETCO2 and breath sounds checked- equal and bilateral ?Tube secured with: Tape ?Dental Injury: Teeth and Oropharynx as per pre-operative assessment  ? ? ? ? ?

## 2021-11-06 ENCOUNTER — Encounter (HOSPITAL_COMMUNITY): Payer: Self-pay | Admitting: Surgery

## 2021-11-09 ENCOUNTER — Encounter: Payer: Self-pay | Admitting: *Deleted

## 2021-11-09 ENCOUNTER — Telehealth: Payer: Self-pay | Admitting: Radiation Oncology

## 2021-11-09 DIAGNOSIS — D0512 Intraductal carcinoma in situ of left breast: Secondary | ICD-10-CM

## 2021-11-09 LAB — SURGICAL PATHOLOGY

## 2021-11-09 NOTE — Telephone Encounter (Signed)
Called patient to schedule a follow-up appointment with Alison. No answer, LVM for a return call. ?

## 2021-11-13 ENCOUNTER — Encounter: Payer: Self-pay | Admitting: Hematology and Oncology

## 2021-11-13 ENCOUNTER — Inpatient Hospital Stay: Payer: 59 | Attending: Hematology and Oncology | Admitting: Hematology and Oncology

## 2021-11-13 ENCOUNTER — Inpatient Hospital Stay: Payer: 59

## 2021-11-13 ENCOUNTER — Other Ambulatory Visit: Payer: Self-pay

## 2021-11-13 DIAGNOSIS — D0512 Intraductal carcinoma in situ of left breast: Secondary | ICD-10-CM | POA: Insufficient documentation

## 2021-11-13 DIAGNOSIS — I10 Essential (primary) hypertension: Secondary | ICD-10-CM | POA: Insufficient documentation

## 2021-11-13 DIAGNOSIS — Z79899 Other long term (current) drug therapy: Secondary | ICD-10-CM | POA: Diagnosis not present

## 2021-11-13 DIAGNOSIS — E119 Type 2 diabetes mellitus without complications: Secondary | ICD-10-CM | POA: Insufficient documentation

## 2021-11-13 DIAGNOSIS — Z87891 Personal history of nicotine dependence: Secondary | ICD-10-CM | POA: Insufficient documentation

## 2021-11-13 NOTE — Assessment & Plan Note (Signed)
Pathology review: I discussed with the patient the difference between DCIS and invasive breast cancer. It is considered a precancerous lesion. DCIS is classified as a Stage 0 breast cancer. It is generally detected through mammograms as calcifications. We discussed the significance of grades and its impact on prognosis. We also discussed the importance of ER and PR receptors and their implications to adjuvant treatment options. Prognosis of DCIS dependence on grade and degree of comedo necrosis. It is anticipated that if not treated, 20-30% of DCIS can develop into invasive breast cancer.  Recommendation: 1. Breast conserving surgery 2. Followed by adjuvant radiation therapy 3. Followed by antiestrogen therapy with tamoxifen/aromatase inhibitors based on menopausal status 5 years  Tamoxifen counseling: We discussed the risks and benefits of tamoxifen. These include but not limited to insomnia, hot flashes, mood changes, vaginal dryness, and weight gain. Although rare, serious side effects including endometrial cancer, risk of blood clots were also discussed. We strongly believe that the benefits far outweigh the risks. Patient understands these risks and consented to starting treatment. Planned treatment duration is 5 years.  Aromatase inhibitors counseling: We have discussed the mechanism of action of aromatase inhibitors today.  We have discussed adverse effects including but not limited to menopausal symptoms, increased risk of osteoporosis and fractures, cardiovascular events, arthralgias and myalgias.  We do believe that the benefits far outweigh the risks.  Plan treatment duration of 5 years.

## 2021-11-13 NOTE — Progress Notes (Signed)
[name] NOTE  Patient Care Team: Kelton Pillar, MD as PCP - General (Family Medicine) Mauro Kaufmann, RN as Oncology Nurse Navigator Rockwell Germany, RN as Oncology Nurse Navigator  CHIEF COMPLAINTS/PURPOSE OF CONSULTATION:  Newly diagnosed breast cancer  HISTORY OF PRESENTING ILLNESS:  Cynthia Baker 66 y.o. female is here because of recent diagnosis of left breast DCIS  I reviewed her records extensively and collaborated the history with the patient.  SUMMARY OF ONCOLOGIC HISTORY: Oncology History  Ductal carcinoma in situ (DCIS) of left breast  09/14/2021 Mammogram   Mammogram showed calcifications in the left breast warranting further evaluation.  No findings in the right breast suspicious for malignancy. Diagnostic mammogram of the left breast showed calcifications measuring 0.4 x 1 x 0.5 cm   10/15/2021 Pathology Results   Left breast needle core biopsy showed DCIS with calcifications and necrosis ER 100% positive strong staining PR 95% positive moderate staining   11/02/2021 Initial Diagnosis   Ductal carcinoma in situ (DCIS) of left breast   11/05/2021 Surgery   Left breast lumpectomy with no residual ductal carcinoma in situ.  Negative for invasive carcinoma    She is here for an initial visit with her husband. She is feeling well, no pain, surgery went very well.  MEDICAL HISTORY:  Past Medical History:  Diagnosis Date   Breast cancer ([place]) 10/15/2021   Diabetes mellitus without complication ([place])    type 2   Hypertension     SURGICAL HISTORY: Past Surgical History:  Procedure Laterality Date   BREAST BIOPSY Right 2013   BREAST LUMPECTOMY WITH RADIOACTIVE SEED LOCALIZATION Left 11/05/2021   Procedure: LEFT BREAST LUMPECTOMY WITH RADIOACTIVE SEED LOCALIZATION;  Surgeon: Donnie Mesa, MD;  Location: [facility];  Service: General;  Laterality: Left;   DERMOID CYST  EXCISION Right 1994   DILATION AND CURETTAGE OF UTERUS      SOCIAL  HISTORY: Social History   Socioeconomic History   Marital status: Married    Spouse name: Not on file   Number of children: Not on file   Years of education: Not on file   Highest education level: Not on file  Occupational History   Not on file  Tobacco Use   Smoking status: Former    Types: Cigarettes    Quit date: 1990    Years since quitting: 33.4   Smokeless tobacco: Never  Vaping Use   Vaping Use: Never used  Substance and Sexual Activity   Alcohol use: Yes    Alcohol/week: 2.0 standard drinks    Types: 2 Glasses of wine per week    Comment: 2 glasses of wine a week   Drug use: Never   Sexual activity: Not on file  Other Topics Concern   Not on file  Social History Narrative   Not on file   Social Determinants of Health   Financial Resource Strain: Not on file  Food Insecurity: Not on file  Transportation Needs: Not on file  Physical Activity: Not on file  Stress: Not on file  Social Connections: Not on file  Intimate Partner Violence: Not on file    FAMILY HISTORY: Family History  Problem Relation Age of Onset   Breast cancer Sister 65    ALLERGIES:  is allergic to bee venom, ace inhibitors, metformin, and nsaids.  MEDICATIONS:  Current Outpatient Medications  Medication Sig Dispense Refill   acetaminophen (TYLENOL) 500 MG tablet Take 500 mg by mouth every 6 (six) hours  as needed.     atorvastatin (LIPITOR) 40 MG tablet Take 40 mg by mouth daily.     cholecalciferol (VITAMIN D) 25 MCG (1000 UNIT) tablet Take 1,000 Units by mouth daily.     losartan-hydrochlorothiazide (HYZAAR) 100-12.5 MG tablet Take 1 tablet by mouth daily.     Probiotic Product (PROBIOTIC PO) Take 1 tablet by mouth daily.     TRIJARDY XR 5-2.10-998 MG TB24 Take 2 tablets by mouth daily.     No current facility-administered medications for this visit.    REVIEW OF SYSTEMS:   Constitutional: Denies fevers, chills or abnormal night sweats Eyes: Denies blurriness of vision, double  vision or watery eyes Ears, nose, mouth, throat, and face: Denies mucositis or sore throat Respiratory: Denies cough, dyspnea or wheezes Cardiovascular: Denies palpitation, chest discomfort or lower extremity swelling Gastrointestinal:  Denies nausea, heartburn or change in bowel habits Skin: Denies abnormal skin rashes Lymphatics: Denies new lymphadenopathy or easy bruising Neurological:Denies numbness, tingling or new weaknesses Behavioral/Psych: Mood is stable, no new changes  Breast: Denies any palpable lumps or discharge All other systems were reviewed with the patient and are negative.  PHYSICAL EXAMINATION: ECOG PERFORMANCE STATUS: 0 - Asymptomatic  Vitals:   11/13/21 1043  BP: (!) 150/91  Pulse: 77  Resp: 18  Temp: 97.7 F (36.5 C)  SpO2: 99%   Filed Weights   11/13/21 1043  Weight: 204 lb 14.4 oz (92.9 kg)    GENERAL:alert, no distress and comfortable BREAST: Steri-Strips in place.  Small area of skin desquamation, healing well  LABORATORY DATA:  I have reviewed the data as listed Lab Results  Component Value Date   WBC 5.5 11/04/2021   HGB 14.7 11/04/2021   HCT 43.8 11/04/2021   MCV 87.6 11/04/2021   PLT 250 11/04/2021   Lab Results  Component Value Date   NA 139 11/04/2021   K 3.9 11/04/2021   CL 105 11/04/2021   CO2 27 11/04/2021    RADIOGRAPHIC STUDIES: I have personally reviewed the radiological reports and agreed with the findings in the report.  ASSESSMENT AND PLAN:   This is a very pleasant 66 year old female patient with DCIS of left breast ER/PR positive referred to medical oncology for recommendations.  We have discussed the following details today with the patient Ductal carcinoma in situ (DCIS) of left breast Pathology review: I discussed with the patient the difference between DCIS and invasive breast cancer. It is considered a precancerous lesion. DCIS is classified as a Stage 0 breast cancer. It is generally detected through mammograms  as calcifications. We discussed the significance of grades and its impact on prognosis. We also discussed the importance of ER and PR receptors and their implications to adjuvant treatment options. Prognosis of DCIS dependence on grade and degree of comedo necrosis. It is anticipated that if not treated, 20-30% of DCIS can develop into invasive breast cancer.  Recommendation: 1. Breast conserving surgery 2. Followed by adjuvant radiation therapy 3. Followed by antiestrogen therapy with tamoxifen/aromatase inhibitors based on menopausal status 5 years  Tamoxifen counseling: We discussed the risks and benefits of tamoxifen. These include but not limited to insomnia, hot flashes, mood changes, vaginal dryness, and weight gain. Although rare, serious side effects including endometrial cancer, risk of blood clots were also discussed. We strongly believe that the benefits far outweigh the risks. Patient understands these risks and consented to starting treatment. Planned treatment duration is 5 years.  Aromatase inhibitors counseling: We have discussed the mechanism of  action of aromatase inhibitors today.  We have discussed adverse effects including but not limited to menopausal symptoms, increased risk of osteoporosis and fractures, cardiovascular events, arthralgias and myalgias.  We do believe that the benefits far outweigh the risks.  Plan treatment duration of 5 years.   Given age above 59 and DCIS, there is no difference in efficacy between tamoxifen and aromatase inhibitors. She would like to proceed with radiation as recommended and will return to clinic end of July.  She is leaning towards tamoxifen as the choice of antiestrogen therapy.  We will once again discuss in July about the above-mentioned recommendations and will initiate antiestrogen therapy after completion of radiation.  Total time spent: 60 minutes including review of records, history, counseling and coordination of care All  questions were answered. The patient knows to call the clinic with any problems, questions or concerns.    Benay Pike, MD 11/13/21

## 2021-11-16 ENCOUNTER — Encounter: Payer: Self-pay | Admitting: *Deleted

## 2021-11-17 ENCOUNTER — Encounter: Payer: Self-pay | Admitting: *Deleted

## 2021-11-17 ENCOUNTER — Encounter: Payer: Self-pay | Admitting: Licensed Clinical Social Worker

## 2021-11-17 NOTE — Progress Notes (Signed)
[facility] Work  Initial Assessment   Cynthia Baker is a 66 y.o. year old female contacted by phone. Clinical Social Work was referred by  new pt protocol  for assessment of psychosocial needs.   SDOH (Social Determinants of Health) assessments performed: Yes SDOH Interventions    Flowsheet Row Most Recent Value  SDOH Interventions   Food Insecurity Interventions Intervention Not Indicated  Financial Strain Interventions Intervention Not Indicated  Housing Interventions Intervention Not Indicated  Transportation Interventions Intervention Not Indicated       SDOH Screenings   Alcohol Screen: Not on file  Depression (PHQ2-9): Not on file  Financial Resource Strain: Low Risk    Difficulty of Paying Living Expenses: Not hard at all  Food Insecurity: No Food Insecurity   Worried About Charity fundraiser in the Last Year: Never true   Arboriculturist in the Last Year: Never true  Housing: Low Risk    Last Housing Risk Score: 0  Physical Activity: Not on file  Social Connections: Not on file  Stress: Not on file  Tobacco Use: Medium Risk   Smoking Tobacco Use: Former   Smokeless Tobacco Use: Never   Passive Exposure: Not on file  Transportation Needs: No Transportation Needs   Lack of Transportation (Medical): No   Lack of Transportation (Non-Medical): No     Distress Screen completed: No    11/04/2021    9:40 AM  ONCBCN DISTRESS SCREENING  Screening Type Initial Screening  Distress experienced in past week (1-10) 5  Practical problem type Insurance  Emotional problem type Nervousness/Anxiety  Information Concerns Type Lack of info about treatment      Family/Social Information:  Housing Arrangement: patient lives with husband Family members/support persons in your life? Family (husband, three sisters locally), Friends, and Education officer, community concerns: no  Employment: Working full time for [employer].  Income source: Employment. Has  sick days available for during radiation if needed Financial concerns: No Type of concern: None Food access concerns: no Religious or spiritual practice: Not known Services Currently in place:  n/a  Coping/ Adjustment to diagnosis: Patient understands treatment plan and what happens next? yes, already had surgery. Will be starting radiation and then tamoxifen Concerns about diagnosis and/or treatment:  Initially worried but feeling better each step of the way Patient reported stressors: Partner- wants him to be supported and not too worried Current coping skills/ strengths: Ability for insight , Capable of independent living , Communication skills , Motivation for treatment/growth , and Supportive family/friends     SUMMARY: Current SDOH Barriers:  No significant SDOH barriers at this time  Clinical Social Work Clinical Goal(s):  No clinical social work goals at this time  Interventions: Discussed common feeling and emotions when being diagnosed with cancer, and the importance of support during treatment Informed patient of the support team roles and support services at [facility] Provided [place] contact information and encouraged patient to call with any questions or concerns   Follow Up Plan: Patient will contact CSW with any support or resource needs Patient verbalizes understanding of plan: Yes    Nieves Barberi E Sparkles Mcneely, LCSW

## 2021-12-02 ENCOUNTER — Telehealth: Payer: Self-pay

## 2021-12-02 NOTE — Telephone Encounter (Signed)
Appointment reminder. Verified patient's identity and reminded her of her 9:00am-12/03/21 in-person appointment w/ Shona Simpson PA-C. I advised patient to arrive 34mn early for check-in. I left my extension [phone] case patient needs anything. Patient verbalized understanding.

## 2021-12-03 ENCOUNTER — Ambulatory Visit
Admission: RE | Admit: 2021-12-03 | Discharge: 2021-12-03 | Disposition: A | Discharge status: Home / Self Care | Payer: 59 | Source: Ambulatory Visit | Attending: Radiation Oncology | Admitting: Radiation Oncology

## 2021-12-03 ENCOUNTER — Encounter: Payer: Self-pay | Admitting: Radiation Oncology

## 2021-12-03 ENCOUNTER — Other Ambulatory Visit: Payer: Self-pay

## 2021-12-03 ENCOUNTER — Encounter (HOSPITAL_COMMUNITY): Payer: Self-pay

## 2021-12-03 VITALS — BP 142/88 | HR 76 | Temp 97.0°F | Resp 18 | Ht 69.0 in | Wt 199.0 lb

## 2021-12-03 DIAGNOSIS — Z170 Estrogen receptor positive status [ER+]: Secondary | ICD-10-CM | POA: Insufficient documentation

## 2021-12-03 DIAGNOSIS — Z510 Encounter for antineoplastic radiation therapy: Secondary | ICD-10-CM | POA: Insufficient documentation

## 2021-12-03 DIAGNOSIS — D0512 Intraductal carcinoma in situ of left breast: Secondary | ICD-10-CM

## 2021-12-03 NOTE — Progress Notes (Signed)
Radiation Oncology         [phone]) [phone] ________________________________  Name: Cynthia Baker        MRN: [id number]  Date of Service: 12/03/2021 DOB: [birth year]  EE:[id number], Margaretha Sheffield, MD  Benay Pike, MD     REFERRING PHYSICIAN: Benay Pike, MD   DIAGNOSIS: The encounter diagnosis was Ductal carcinoma in situ (DCIS) of left breast.   HISTORY OF PRESENT ILLNESS: Cynthia Baker is a 66 y.o. female originally seen in the multidisciplinary breast clinic for a new diagnosis of left breast cancer. The patient was noted to have screening detected calcifications in the left breast.  Further diagnostic mammogram showed a group of calcifications measuring up to 10 mm in the posterior medial aspect of the left breast.  She underwent stereotactic biopsy on 10/15/2021 which showed intermediate grade DCIS with calcifications and necrosis.  Her cancer was ER/PR positive.    Since her last visit, she underwent left lumpectomy with Dr. Georgette Dover on 11/05/2021.  Final pathology showed no residual DCIS.  No evidence of invasive carcinoma were identified in the prior biopsy site changes were noted.  She is seen today to discuss adjuvant radiotherapy.      PREVIOUS RADIATION THERAPY: No   PAST MEDICAL HISTORY:  Past Medical History:  Diagnosis Date   Breast cancer ([place]) 10/15/2021   Diabetes mellitus without complication ([place])    type 2   Hypertension        PAST SURGICAL HISTORY: Past Surgical History:  Procedure Laterality Date   BREAST BIOPSY Right 2013   BREAST LUMPECTOMY WITH RADIOACTIVE SEED LOCALIZATION Left 11/05/2021   Procedure: LEFT BREAST LUMPECTOMY WITH RADIOACTIVE SEED LOCALIZATION;  Surgeon: Donnie Mesa, MD;  Location: [place];  Service: General;  Laterality: Left;   DERMOID CYST  EXCISION Right 1994   DILATION AND CURETTAGE OF UTERUS       FAMILY HISTORY:  Family History  Problem Relation Age of Onset   Breast cancer Sister 58     SOCIAL HISTORY:  reports that  she quit smoking about 33 years ago. Her smoking use included cigarettes. She has never used smokeless tobacco. She reports current alcohol use of about 2.0 standard drinks of alcohol per week. She reports that she does not use drugs.  The patient is married and lives in [place].  She works for [employer] and [employer].  She enjoys gardening, swimming, and reading.   ALLERGIES: Bee venom, Ace inhibitors, Metformin, and Nsaids   MEDICATIONS:  Current Outpatient Medications  Medication Sig Dispense Refill   acetaminophen (TYLENOL) 500 MG tablet Take 500 mg by mouth every 6 (six) hours as needed.     atorvastatin (LIPITOR) 40 MG tablet Take 40 mg by mouth daily.     cholecalciferol (VITAMIN D) 25 MCG (1000 UNIT) tablet Take 1,000 Units by mouth daily.     losartan-hydrochlorothiazide (HYZAAR) 100-12.5 MG tablet Take 1 tablet by mouth daily.     Probiotic Product (PROBIOTIC PO) Take 1 tablet by mouth daily.     TRIJARDY XR 5-2.10-998 MG TB24 Take 2 tablets by mouth daily.     No current facility-administered medications for this encounter.     REVIEW OF SYSTEMS: On review of systems, the patient reports that she is doing very well overall without breast complaints.      PHYSICAL EXAM:  Wt Readings from Last 3 Encounters:  12/03/21 199 lb (90.3 kg)  11/13/21 204 lb 14.4 oz (92.9 kg)  11/05/21 202 lb (  91.6 kg)   Temp Readings from Last 3 Encounters:  12/03/21 (!) 97 F (36.1 C) (Temporal)  11/13/21 97.7 F (36.5 C) (Temporal)  11/05/21 (!) 97.1 F (36.2 C)   BP Readings from Last 3 Encounters:  12/03/21 (!) 142/88  11/13/21 (!) 150/91  11/05/21 (!) 153/75   Pulse Readings from Last 3 Encounters:  12/03/21 76  11/13/21 77  11/05/21 70    In general this is a well appearing Caucasian female in no acute distress. She's alert and oriented x4 and appropriate throughout the examination. Cardiopulmonary assessment is negative for acute  distress and she exhibits normal effort.  Her left breast shows a well-healed surgical incision site without erythema separation or drainage.    ECOG = 0  0 - Asymptomatic (Fully active, able to carry on all predisease activities without restriction)  1 - Symptomatic but completely ambulatory (Restricted in physically strenuous activity but ambulatory and able to carry out work of a light or sedentary nature. For example, light housework, office work)  2 - Symptomatic, <50% in bed during the day (Ambulatory and capable of all self care but unable to carry out any work activities. Up and about more than 50% of waking hours)  3 - Symptomatic, >50% in bed, but not bedbound (Capable of only limited self-care, confined to bed or chair 50% or more of waking hours)  4 - Bedbound (Completely disabled. Cannot carry on any self-care. Totally confined to bed or chair)  5 - Death   Eustace Pen MM, Creech RH, Tormey DC, et al. [phone]). "Toxicity and response criteria of the [facility]". [place] Oncol. 5 (6): 649-55    LABORATORY DATA:  Lab Results  Component Value Date   WBC 5.5 11/04/2021   HGB 14.7 11/04/2021   HCT 43.8 11/04/2021   MCV 87.6 11/04/2021   PLT 250 11/04/2021   Lab Results  Component Value Date   NA 139 11/04/2021   K 3.9 11/04/2021   CL 105 11/04/2021   CO2 27 11/04/2021   No results found for: "ALT", "AST", "GGT", "ALKPHOS", "BILITOT"    RADIOGRAPHY: MM Breast Surgical Specimen  Result Date: 11/05/2021 CLINICAL DATA:  Specimen radiograph status post left breast lumpectomy. EXAM: SPECIMEN RADIOGRAPH OF THE LEFT BREAST COMPARISON:  None Available. FINDINGS: Status post excision of the left breast. The radioactive seed and biopsy marker clip are present, completely intact, and were marked for pathology. These findings were communicated to the OR at 10:20 a.m. IMPRESSION: Specimen radiograph of the left breast. Electronically Signed   By: Ammie Ferrier M.D.   On: 11/05/2021 10:20  MM LT RADIOACTIVE SEED LOC MAMMO GUIDE  Result Date: 11/04/2021 CLINICAL DATA:  Biopsy proven ductal carcinoma in-situ in the left breast. Radioactive seed localization prior to surgery EXAM: MAMMOGRAPHIC GUIDED RADIOACTIVE SEED LOCALIZATION OF THE LEFT BREAST COMPARISON:  None Available. FINDINGS: Patient presents for radioactive seed localization prior to surgery. I met with the patient and we discussed the procedure of seed localization including benefits and alternatives. We discussed the high likelihood of a successful procedure. We discussed the risks of the procedure including infection, bleeding, tissue injury and further surgery. We discussed the low dose of radioactivity involved in the procedure. Informed, written consent was given. The usual time-out protocol was performed immediately prior to the procedure. Using mammographic guidance, sterile technique, 1% lidocaine and an I-125 radioactive seed, the coil shaped clip was localized using a medial to lateral approach. The follow-up mammogram images  confirm the seed in the expected location and were marked for Dr. Georgette Dover. Follow-up survey of the patient confirms presence of the radioactive seed. Order number of I-125 seed:  [id number]. Total activity:  8.372 millicuries reference Date: 10/05/2021 The patient tolerated the procedure well and was released from the [facility]. She was given instructions regarding seed removal. IMPRESSION: Radioactive seed localization left breast. No apparent complications. Electronically Signed   By: Lillia Mountain M.D.   On: 11/04/2021 14:25      IMPRESSION/PLAN: 1. Intermediate Grade, ER/PR positive DCIS of the left breast. Dr. Lisbeth Renshaw has reviewed her case and today we discussed the pathology findings and reviews the nature of early stage left breast disease. Dr. Georgette Dover has recommended breast conservation with lumpectomy. Dr. Lisbeth Renshaw has recommended external radiotherapy to the  breast  to reduce risks of local recurrence followed by antiestrogen therapy. We discussed the risks, benefits, short, and long term effects of radiotherapy, as well as the curative intent, and the patient is interested in proceeding. I discussed the delivery and logistics of radiotherapy and Dr. Lisbeth Renshaw recommends 4 weeks of radiotherapy to the left breast with deep inspiration breath hold technique. Written consent is obtained and placed in the chart, a copy was provided to the patient. She will simulate for treatment this morning.     In a visit lasting 45 minutes, greater than 50% of the time was spent face to face reviewing her case, as well as in preparation of, discussing, and coordinating the patient's care.       Carola Rhine, [facility]    **Disclaimer: This note was dictated with voice recognition software. Similar sounding words can inadvertently be transcribed and this note may contain transcription errors which may not have been corrected upon publication of note.**

## 2021-12-03 NOTE — Progress Notes (Signed)
Follow-up- new appointment. I verified patient's identity and began nursing interview w/ spouse Mr. Muskaan Smet. No issues reported at this time.  Meaningful use complete. Postmenopausal- NO chances of pregnancy.  BP (!) 142/88 (BP Location: Right Arm, Patient Position: Sitting, Cuff Size: Normal)   Pulse 76   Temp (!) 97 F (36.1 C) (Temporal)   Resp 18   Ht '5\' 9"'$  (1.753 m)   Wt 199 lb (90.3 kg)   SpO2 98%   BMI 29.39 kg/m

## 2021-12-10 DIAGNOSIS — Z510 Encounter for antineoplastic radiation therapy: Secondary | ICD-10-CM | POA: Diagnosis not present

## 2021-12-14 ENCOUNTER — Encounter: Payer: Self-pay | Admitting: *Deleted

## 2021-12-15 ENCOUNTER — Ambulatory Visit
Admission: RE | Admit: 2021-12-15 | Discharge: 2021-12-15 | Disposition: A | Discharge status: Home / Self Care | Payer: 59 | Source: Ambulatory Visit | Attending: Radiation Oncology | Admitting: Radiation Oncology

## 2021-12-15 ENCOUNTER — Other Ambulatory Visit: Payer: Self-pay

## 2021-12-15 DIAGNOSIS — Z510 Encounter for antineoplastic radiation therapy: Secondary | ICD-10-CM | POA: Diagnosis not present

## 2021-12-15 LAB — RAD ONC ARIA SESSION SUMMARY
Course Elapsed Days: 0
Plan Fractions Treated to Date: 1
Plan Prescribed Dose Per Fraction: 2.66 Gy
Plan Total Fractions Prescribed: 16
Plan Total Prescribed Dose: 42.56 Gy
Reference Point Dosage Given to Date: 2.66 Gy
Reference Point Session Dosage Given: 2.66 Gy
Session Number: 1

## 2021-12-16 ENCOUNTER — Other Ambulatory Visit: Payer: Self-pay

## 2021-12-16 ENCOUNTER — Ambulatory Visit
Admission: RE | Admit: 2021-12-16 | Discharge: 2021-12-16 | Disposition: A | Discharge status: Home / Self Care | Payer: 59 | Source: Ambulatory Visit | Attending: Radiation Oncology | Admitting: Radiation Oncology

## 2021-12-16 DIAGNOSIS — Z510 Encounter for antineoplastic radiation therapy: Secondary | ICD-10-CM | POA: Diagnosis not present

## 2021-12-16 LAB — RAD ONC ARIA SESSION SUMMARY
Course Elapsed Days: 1
Plan Fractions Treated to Date: 2
Plan Prescribed Dose Per Fraction: 2.66 Gy
Plan Total Fractions Prescribed: 16
Plan Total Prescribed Dose: 42.56 Gy
Reference Point Dosage Given to Date: 5.32 Gy
Reference Point Session Dosage Given: 2.66 Gy
Session Number: 2

## 2021-12-17 ENCOUNTER — Other Ambulatory Visit: Payer: Self-pay

## 2021-12-17 ENCOUNTER — Ambulatory Visit
Admission: RE | Admit: 2021-12-17 | Discharge: 2021-12-17 | Disposition: A | Discharge status: Home / Self Care | Payer: 59 | Source: Ambulatory Visit | Attending: Radiation Oncology | Admitting: Radiation Oncology

## 2021-12-17 DIAGNOSIS — Z510 Encounter for antineoplastic radiation therapy: Secondary | ICD-10-CM | POA: Diagnosis not present

## 2021-12-17 LAB — RAD ONC ARIA SESSION SUMMARY
Course Elapsed Days: 2
Plan Fractions Treated to Date: 3
Plan Prescribed Dose Per Fraction: 2.66 Gy
Plan Total Fractions Prescribed: 16
Plan Total Prescribed Dose: 42.56 Gy
Reference Point Dosage Given to Date: 7.98 Gy
Reference Point Session Dosage Given: 2.66 Gy
Session Number: 3

## 2021-12-18 ENCOUNTER — Ambulatory Visit
Admission: RE | Admit: 2021-12-18 | Discharge: 2021-12-18 | Disposition: A | Discharge status: Home / Self Care | Payer: 59 | Source: Ambulatory Visit | Attending: Radiation Oncology | Admitting: Radiation Oncology

## 2021-12-18 ENCOUNTER — Other Ambulatory Visit: Payer: Self-pay

## 2021-12-18 DIAGNOSIS — Z510 Encounter for antineoplastic radiation therapy: Secondary | ICD-10-CM | POA: Diagnosis not present

## 2021-12-18 LAB — RAD ONC ARIA SESSION SUMMARY
Course Elapsed Days: 3
Plan Fractions Treated to Date: 4
Plan Prescribed Dose Per Fraction: 2.66 Gy
Plan Total Fractions Prescribed: 16
Plan Total Prescribed Dose: 42.56 Gy
Reference Point Dosage Given to Date: 10.64 Gy
Reference Point Session Dosage Given: 2.66 Gy
Session Number: 4

## 2021-12-21 ENCOUNTER — Ambulatory Visit
Admission: RE | Admit: 2021-12-21 | Discharge: 2021-12-21 | Disposition: A | Discharge status: Home / Self Care | Payer: 59 | Source: Ambulatory Visit | Attending: Radiation Oncology | Admitting: Radiation Oncology

## 2021-12-21 ENCOUNTER — Other Ambulatory Visit: Payer: Self-pay

## 2021-12-21 DIAGNOSIS — Z510 Encounter for antineoplastic radiation therapy: Secondary | ICD-10-CM | POA: Diagnosis not present

## 2021-12-21 LAB — RAD ONC ARIA SESSION SUMMARY
Course Elapsed Days: 6
Plan Fractions Treated to Date: 5
Plan Prescribed Dose Per Fraction: 2.66 Gy
Plan Total Fractions Prescribed: 16
Plan Total Prescribed Dose: 42.56 Gy
Reference Point Dosage Given to Date: 13.3 Gy
Reference Point Session Dosage Given: 2.66 Gy
Session Number: 5

## 2021-12-22 ENCOUNTER — Ambulatory Visit
Admission: RE | Admit: 2021-12-22 | Discharge: 2021-12-22 | Disposition: A | Discharge status: Home / Self Care | Payer: 59 | Source: Ambulatory Visit | Attending: Radiation Oncology | Admitting: Radiation Oncology

## 2021-12-22 ENCOUNTER — Other Ambulatory Visit: Payer: Self-pay

## 2021-12-22 DIAGNOSIS — Z510 Encounter for antineoplastic radiation therapy: Secondary | ICD-10-CM | POA: Diagnosis not present

## 2021-12-22 LAB — RAD ONC ARIA SESSION SUMMARY
Course Elapsed Days: 7
Plan Fractions Treated to Date: 6
Plan Prescribed Dose Per Fraction: 2.66 Gy
Plan Total Fractions Prescribed: 16
Plan Total Prescribed Dose: 42.56 Gy
Reference Point Dosage Given to Date: 15.96 Gy
Reference Point Session Dosage Given: 2.66 Gy
Session Number: 6

## 2021-12-23 ENCOUNTER — Ambulatory Visit
Admission: RE | Admit: 2021-12-23 | Discharge: 2021-12-23 | Disposition: A | Discharge status: Home / Self Care | Payer: 59 | Source: Ambulatory Visit | Attending: Radiation Oncology | Admitting: Radiation Oncology

## 2021-12-23 ENCOUNTER — Other Ambulatory Visit: Payer: Self-pay

## 2021-12-23 DIAGNOSIS — Z510 Encounter for antineoplastic radiation therapy: Secondary | ICD-10-CM | POA: Diagnosis not present

## 2021-12-23 LAB — RAD ONC ARIA SESSION SUMMARY
Course Elapsed Days: 8
Plan Fractions Treated to Date: 7
Plan Prescribed Dose Per Fraction: 2.66 Gy
Plan Total Fractions Prescribed: 16
Plan Total Prescribed Dose: 42.56 Gy
Reference Point Dosage Given to Date: 18.62 Gy
Reference Point Session Dosage Given: 2.66 Gy
Session Number: 7

## 2021-12-24 ENCOUNTER — Other Ambulatory Visit: Payer: Self-pay

## 2021-12-24 ENCOUNTER — Ambulatory Visit
Admission: RE | Admit: 2021-12-24 | Discharge: 2021-12-24 | Disposition: A | Discharge status: Home / Self Care | Payer: 59 | Source: Ambulatory Visit | Attending: Radiation Oncology | Admitting: Radiation Oncology

## 2021-12-24 DIAGNOSIS — Z510 Encounter for antineoplastic radiation therapy: Secondary | ICD-10-CM | POA: Diagnosis not present

## 2021-12-24 LAB — RAD ONC ARIA SESSION SUMMARY
Course Elapsed Days: 9
Plan Fractions Treated to Date: 8
Plan Prescribed Dose Per Fraction: 2.66 Gy
Plan Total Fractions Prescribed: 16
Plan Total Prescribed Dose: 42.56 Gy
Reference Point Dosage Given to Date: 21.28 Gy
Reference Point Session Dosage Given: 2.66 Gy
Session Number: 8

## 2021-12-25 ENCOUNTER — Other Ambulatory Visit: Payer: Self-pay

## 2021-12-25 ENCOUNTER — Telehealth: Payer: Self-pay

## 2021-12-25 ENCOUNTER — Ambulatory Visit
Admission: RE | Admit: 2021-12-25 | Discharge: 2021-12-25 | Disposition: A | Discharge status: Home / Self Care | Payer: 59 | Source: Ambulatory Visit | Attending: Radiation Oncology | Admitting: Radiation Oncology

## 2021-12-25 DIAGNOSIS — Z510 Encounter for antineoplastic radiation therapy: Secondary | ICD-10-CM | POA: Diagnosis not present

## 2021-12-25 LAB — RAD ONC ARIA SESSION SUMMARY
Course Elapsed Days: 10
Plan Fractions Treated to Date: 9
Plan Prescribed Dose Per Fraction: 2.66 Gy
Plan Total Fractions Prescribed: 16
Plan Total Prescribed Dose: 42.56 Gy
Reference Point Dosage Given to Date: 23.94 Gy
Reference Point Session Dosage Given: 2.66 Gy
Session Number: 9

## 2021-12-25 NOTE — Telephone Encounter (Signed)
Notified Patient of completion of FMLA paperwork. Copy of paperwork placed for pick-up as requested by Patient. No other needs or concerns voiced at this time.

## 2021-12-28 ENCOUNTER — Ambulatory Visit
Admission: RE | Admit: 2021-12-28 | Discharge: 2021-12-28 | Disposition: A | Discharge status: Home / Self Care | Payer: 59 | Source: Ambulatory Visit | Attending: Radiation Oncology | Admitting: Radiation Oncology

## 2021-12-28 ENCOUNTER — Other Ambulatory Visit: Payer: Self-pay

## 2021-12-28 DIAGNOSIS — Z170 Estrogen receptor positive status [ER+]: Secondary | ICD-10-CM | POA: Insufficient documentation

## 2021-12-28 DIAGNOSIS — D0512 Intraductal carcinoma in situ of left breast: Secondary | ICD-10-CM | POA: Diagnosis not present

## 2021-12-28 DIAGNOSIS — Z510 Encounter for antineoplastic radiation therapy: Secondary | ICD-10-CM | POA: Diagnosis not present

## 2021-12-28 LAB — RAD ONC ARIA SESSION SUMMARY
Course Elapsed Days: 13
Plan Fractions Treated to Date: 10
Plan Prescribed Dose Per Fraction: 2.66 Gy
Plan Total Fractions Prescribed: 16
Plan Total Prescribed Dose: 42.56 Gy
Reference Point Dosage Given to Date: 26.6 Gy
Reference Point Session Dosage Given: 2.66 Gy
Session Number: 10

## 2021-12-30 ENCOUNTER — Other Ambulatory Visit: Payer: Self-pay

## 2021-12-30 ENCOUNTER — Ambulatory Visit
Admission: RE | Admit: 2021-12-30 | Discharge: 2021-12-30 | Disposition: A | Discharge status: Home / Self Care | Payer: 59 | Source: Ambulatory Visit | Attending: Radiation Oncology | Admitting: Radiation Oncology

## 2021-12-30 DIAGNOSIS — Z170 Estrogen receptor positive status [ER+]: Secondary | ICD-10-CM | POA: Diagnosis not present

## 2021-12-30 DIAGNOSIS — D0512 Intraductal carcinoma in situ of left breast: Secondary | ICD-10-CM | POA: Diagnosis not present

## 2021-12-30 DIAGNOSIS — Z510 Encounter for antineoplastic radiation therapy: Secondary | ICD-10-CM | POA: Diagnosis not present

## 2021-12-30 LAB — RAD ONC ARIA SESSION SUMMARY
Course Elapsed Days: 15
Plan Fractions Treated to Date: 11
Plan Prescribed Dose Per Fraction: 2.66 Gy
Plan Total Fractions Prescribed: 16
Plan Total Prescribed Dose: 42.56 Gy
Reference Point Dosage Given to Date: 29.26 Gy
Reference Point Session Dosage Given: 2.66 Gy
Session Number: 11

## 2021-12-31 ENCOUNTER — Ambulatory Visit
Admission: RE | Admit: 2021-12-31 | Discharge: 2021-12-31 | Disposition: A | Discharge status: Home / Self Care | Payer: 59 | Source: Ambulatory Visit | Attending: Radiation Oncology | Admitting: Radiation Oncology

## 2021-12-31 ENCOUNTER — Other Ambulatory Visit: Payer: Self-pay

## 2021-12-31 DIAGNOSIS — Z170 Estrogen receptor positive status [ER+]: Secondary | ICD-10-CM | POA: Diagnosis not present

## 2021-12-31 DIAGNOSIS — D0512 Intraductal carcinoma in situ of left breast: Secondary | ICD-10-CM | POA: Diagnosis not present

## 2021-12-31 DIAGNOSIS — Z510 Encounter for antineoplastic radiation therapy: Secondary | ICD-10-CM | POA: Diagnosis not present

## 2021-12-31 LAB — RAD ONC ARIA SESSION SUMMARY
Course Elapsed Days: 16
Plan Fractions Treated to Date: 12
Plan Prescribed Dose Per Fraction: 2.66 Gy
Plan Total Fractions Prescribed: 16
Plan Total Prescribed Dose: 42.56 Gy
Reference Point Dosage Given to Date: 31.92 Gy
Reference Point Session Dosage Given: 2.66 Gy
Session Number: 12

## 2022-01-01 ENCOUNTER — Ambulatory Visit: Payer: 59 | Admitting: Radiation Oncology

## 2022-01-01 ENCOUNTER — Ambulatory Visit
Admission: RE | Admit: 2022-01-01 | Discharge: 2022-01-01 | Disposition: A | Discharge status: Home / Self Care | Payer: 59 | Source: Ambulatory Visit | Attending: Radiation Oncology | Admitting: Radiation Oncology

## 2022-01-01 ENCOUNTER — Other Ambulatory Visit: Payer: Self-pay

## 2022-01-01 DIAGNOSIS — Z170 Estrogen receptor positive status [ER+]: Secondary | ICD-10-CM | POA: Diagnosis not present

## 2022-01-01 DIAGNOSIS — Z510 Encounter for antineoplastic radiation therapy: Secondary | ICD-10-CM | POA: Diagnosis not present

## 2022-01-01 DIAGNOSIS — D0512 Intraductal carcinoma in situ of left breast: Secondary | ICD-10-CM | POA: Diagnosis not present

## 2022-01-01 LAB — RAD ONC ARIA SESSION SUMMARY
Course Elapsed Days: 17
Plan Fractions Treated to Date: 13
Plan Prescribed Dose Per Fraction: 2.66 Gy
Plan Total Fractions Prescribed: 16
Plan Total Prescribed Dose: 42.56 Gy
Reference Point Dosage Given to Date: 34.58 Gy
Reference Point Session Dosage Given: 2.66 Gy
Session Number: 13

## 2022-01-04 ENCOUNTER — Other Ambulatory Visit: Payer: Self-pay

## 2022-01-04 ENCOUNTER — Ambulatory Visit
Admission: RE | Admit: 2022-01-04 | Discharge: 2022-01-04 | Disposition: A | Discharge status: Home / Self Care | Payer: 59 | Source: Ambulatory Visit | Attending: Radiation Oncology | Admitting: Radiation Oncology

## 2022-01-04 ENCOUNTER — Ambulatory Visit: Payer: 59

## 2022-01-04 DIAGNOSIS — D0512 Intraductal carcinoma in situ of left breast: Secondary | ICD-10-CM | POA: Diagnosis not present

## 2022-01-04 DIAGNOSIS — Z510 Encounter for antineoplastic radiation therapy: Secondary | ICD-10-CM | POA: Diagnosis not present

## 2022-01-04 DIAGNOSIS — Z170 Estrogen receptor positive status [ER+]: Secondary | ICD-10-CM | POA: Diagnosis not present

## 2022-01-04 LAB — RAD ONC ARIA SESSION SUMMARY
Course Elapsed Days: 20
Plan Fractions Treated to Date: 14
Plan Prescribed Dose Per Fraction: 2.66 Gy
Plan Total Fractions Prescribed: 16
Plan Total Prescribed Dose: 42.56 Gy
Reference Point Dosage Given to Date: 37.24 Gy
Reference Point Session Dosage Given: 2.66 Gy
Session Number: 14

## 2022-01-05 ENCOUNTER — Ambulatory Visit: Payer: 59

## 2022-01-05 ENCOUNTER — Other Ambulatory Visit: Payer: Self-pay

## 2022-01-05 ENCOUNTER — Ambulatory Visit
Admission: RE | Admit: 2022-01-05 | Discharge: 2022-01-05 | Disposition: A | Discharge status: Home / Self Care | Payer: 59 | Source: Ambulatory Visit | Attending: Radiation Oncology | Admitting: Radiation Oncology

## 2022-01-05 DIAGNOSIS — Z510 Encounter for antineoplastic radiation therapy: Secondary | ICD-10-CM | POA: Diagnosis not present

## 2022-01-05 DIAGNOSIS — D0512 Intraductal carcinoma in situ of left breast: Secondary | ICD-10-CM | POA: Diagnosis not present

## 2022-01-05 DIAGNOSIS — Z170 Estrogen receptor positive status [ER+]: Secondary | ICD-10-CM | POA: Diagnosis not present

## 2022-01-05 LAB — RAD ONC ARIA SESSION SUMMARY
Course Elapsed Days: 21
Plan Fractions Treated to Date: 15
Plan Prescribed Dose Per Fraction: 2.66 Gy
Plan Total Fractions Prescribed: 16
Plan Total Prescribed Dose: 42.56 Gy
Reference Point Dosage Given to Date: 39.9 Gy
Reference Point Session Dosage Given: 2.66 Gy
Session Number: 15

## 2022-01-06 ENCOUNTER — Other Ambulatory Visit: Payer: Self-pay

## 2022-01-06 ENCOUNTER — Ambulatory Visit
Admission: RE | Admit: 2022-01-06 | Discharge: 2022-01-06 | Disposition: A | Discharge status: Home / Self Care | Payer: 59 | Source: Ambulatory Visit | Attending: Radiation Oncology | Admitting: Radiation Oncology

## 2022-01-06 ENCOUNTER — Ambulatory Visit: Payer: 59

## 2022-01-06 DIAGNOSIS — D0512 Intraductal carcinoma in situ of left breast: Secondary | ICD-10-CM | POA: Diagnosis not present

## 2022-01-06 DIAGNOSIS — Z170 Estrogen receptor positive status [ER+]: Secondary | ICD-10-CM | POA: Diagnosis not present

## 2022-01-06 DIAGNOSIS — Z510 Encounter for antineoplastic radiation therapy: Secondary | ICD-10-CM | POA: Diagnosis not present

## 2022-01-06 LAB — RAD ONC ARIA SESSION SUMMARY
Course Elapsed Days: 22
Plan Fractions Treated to Date: 16
Plan Prescribed Dose Per Fraction: 2.66 Gy
Plan Total Fractions Prescribed: 16
Plan Total Prescribed Dose: 42.56 Gy
Reference Point Dosage Given to Date: 42.56 Gy
Reference Point Session Dosage Given: 2.66 Gy
Session Number: 16

## 2022-01-07 ENCOUNTER — Ambulatory Visit: Payer: 59

## 2022-01-07 ENCOUNTER — Other Ambulatory Visit: Payer: Self-pay

## 2022-01-07 ENCOUNTER — Ambulatory Visit
Admission: RE | Admit: 2022-01-07 | Discharge: 2022-01-07 | Disposition: A | Discharge status: Home / Self Care | Payer: 59 | Source: Ambulatory Visit | Attending: Radiation Oncology | Admitting: Radiation Oncology

## 2022-01-07 DIAGNOSIS — D0512 Intraductal carcinoma in situ of left breast: Secondary | ICD-10-CM | POA: Diagnosis not present

## 2022-01-07 DIAGNOSIS — Z170 Estrogen receptor positive status [ER+]: Secondary | ICD-10-CM | POA: Diagnosis not present

## 2022-01-07 DIAGNOSIS — Z510 Encounter for antineoplastic radiation therapy: Secondary | ICD-10-CM | POA: Diagnosis not present

## 2022-01-07 LAB — RAD ONC ARIA SESSION SUMMARY
Course Elapsed Days: 23
Plan Fractions Treated to Date: 1
Plan Prescribed Dose Per Fraction: 2 Gy
Plan Total Fractions Prescribed: 4
Plan Total Prescribed Dose: 8 Gy
Reference Point Dosage Given to Date: 44.56 Gy
Reference Point Session Dosage Given: 2 Gy
Session Number: 17

## 2022-01-08 ENCOUNTER — Other Ambulatory Visit: Payer: Self-pay

## 2022-01-08 ENCOUNTER — Ambulatory Visit: Payer: 59

## 2022-01-08 ENCOUNTER — Ambulatory Visit
Admission: RE | Admit: 2022-01-08 | Discharge: 2022-01-08 | Disposition: A | Discharge status: Home / Self Care | Payer: 59 | Source: Ambulatory Visit | Attending: Radiation Oncology | Admitting: Radiation Oncology

## 2022-01-08 DIAGNOSIS — D0512 Intraductal carcinoma in situ of left breast: Secondary | ICD-10-CM | POA: Diagnosis not present

## 2022-01-08 DIAGNOSIS — Z510 Encounter for antineoplastic radiation therapy: Secondary | ICD-10-CM | POA: Diagnosis not present

## 2022-01-08 DIAGNOSIS — Z170 Estrogen receptor positive status [ER+]: Secondary | ICD-10-CM | POA: Diagnosis not present

## 2022-01-08 LAB — RAD ONC ARIA SESSION SUMMARY
Course Elapsed Days: 24
Plan Fractions Treated to Date: 2
Plan Prescribed Dose Per Fraction: 2 Gy
Plan Total Fractions Prescribed: 4
Plan Total Prescribed Dose: 8 Gy
Reference Point Dosage Given to Date: 46.56 Gy
Reference Point Session Dosage Given: 2 Gy
Session Number: 18

## 2022-01-11 ENCOUNTER — Encounter: Payer: Self-pay | Admitting: *Deleted

## 2022-01-11 ENCOUNTER — Ambulatory Visit: Payer: 59

## 2022-01-11 ENCOUNTER — Ambulatory Visit: Admission: RE | Admit: 2022-01-11 | Payer: 59 | Source: Ambulatory Visit

## 2022-01-11 ENCOUNTER — Other Ambulatory Visit: Payer: Self-pay

## 2022-01-11 DIAGNOSIS — D0512 Intraductal carcinoma in situ of left breast: Secondary | ICD-10-CM

## 2022-01-11 NOTE — Progress Notes (Signed)
                                                                                                                                                              Patient Name: Cynthia Baker MRN: [id number] DOB: [birth year] Referring Physician: Kelton Pillar (Profile Not Attached) Date of Service: 01/13/2022 [facility] [place]                                                        End Of Treatment Note  Diagnoses: D05.12-Intraductal carcinoma in situ of left breast  Cancer Staging: Intermediate Grade, ER/PR positive DCIS of the left breast  Intent: Curative  Radiation Treatment Dates: 12/15/2021 through 01/13/2022 Site Technique Total Dose (Gy) Dose per Fx (Gy) Completed Fx Beam Energies  Breast, Left: Breast_L 3D 42.56/42.56 2.66 16/16 10X, 6XFFF  Breast, Left: Breast_L_Bst 3D 8/8 2 4/4 6X, 10X   Narrative: The patient tolerated radiation therapy relatively well. She developed fatigue and anticipated skin changes in the treatment field.   Plan: The patient will receive a call in about one month from the radiation oncology department. She will continue follow up with Dr. Chryl Heck as well.   ________________________________________________    Carola Rhine, [facility]

## 2022-01-12 ENCOUNTER — Ambulatory Visit
Admission: RE | Admit: 2022-01-12 | Discharge: 2022-01-12 | Disposition: A | Discharge status: Home / Self Care | Payer: 59 | Source: Ambulatory Visit | Attending: Radiation Oncology | Admitting: Radiation Oncology

## 2022-01-12 ENCOUNTER — Other Ambulatory Visit: Payer: Self-pay

## 2022-01-12 ENCOUNTER — Ambulatory Visit: Payer: 59

## 2022-01-12 ENCOUNTER — Encounter: Payer: Self-pay | Admitting: Radiation Oncology

## 2022-01-12 DIAGNOSIS — D0512 Intraductal carcinoma in situ of left breast: Secondary | ICD-10-CM | POA: Diagnosis not present

## 2022-01-12 DIAGNOSIS — Z170 Estrogen receptor positive status [ER+]: Secondary | ICD-10-CM | POA: Diagnosis not present

## 2022-01-12 DIAGNOSIS — Z510 Encounter for antineoplastic radiation therapy: Secondary | ICD-10-CM | POA: Diagnosis not present

## 2022-01-12 LAB — RAD ONC ARIA SESSION SUMMARY
Course Elapsed Days: 28
Plan Fractions Treated to Date: 3
Plan Prescribed Dose Per Fraction: 2 Gy
Plan Total Fractions Prescribed: 4
Plan Total Prescribed Dose: 8 Gy
Reference Point Dosage Given to Date: 48.56 Gy
Reference Point Session Dosage Given: 2 Gy
Session Number: 19

## 2022-01-13 ENCOUNTER — Other Ambulatory Visit: Payer: Self-pay

## 2022-01-13 ENCOUNTER — Ambulatory Visit
Admission: RE | Admit: 2022-01-13 | Discharge: 2022-01-13 | Disposition: A | Discharge status: Home / Self Care | Payer: 59 | Source: Ambulatory Visit | Attending: Radiation Oncology | Admitting: Radiation Oncology

## 2022-01-13 ENCOUNTER — Ambulatory Visit: Payer: 59

## 2022-01-13 DIAGNOSIS — D0512 Intraductal carcinoma in situ of left breast: Secondary | ICD-10-CM | POA: Diagnosis not present

## 2022-01-13 DIAGNOSIS — Z170 Estrogen receptor positive status [ER+]: Secondary | ICD-10-CM | POA: Diagnosis not present

## 2022-01-13 DIAGNOSIS — Z510 Encounter for antineoplastic radiation therapy: Secondary | ICD-10-CM | POA: Diagnosis not present

## 2022-01-13 LAB — RAD ONC ARIA SESSION SUMMARY
Course Elapsed Days: 29
Plan Fractions Treated to Date: 4
Plan Prescribed Dose Per Fraction: 2 Gy
Plan Total Fractions Prescribed: 4
Plan Total Prescribed Dose: 8 Gy
Reference Point Dosage Given to Date: 50.56 Gy
Reference Point Session Dosage Given: 2 Gy
Session Number: 20

## 2022-01-18 ENCOUNTER — Inpatient Hospital Stay: Payer: 59 | Attending: Hematology and Oncology | Admitting: Hematology and Oncology

## 2022-01-18 ENCOUNTER — Encounter: Payer: Self-pay | Admitting: Hematology and Oncology

## 2022-01-18 ENCOUNTER — Other Ambulatory Visit: Payer: Self-pay

## 2022-01-18 VITALS — BP 144/80 | HR 91 | Temp 97.3°F | Resp 17 | Ht 69.0 in | Wt 197.2 lb

## 2022-01-18 DIAGNOSIS — Z79810 Long term (current) use of selective estrogen receptor modulators (SERMs): Secondary | ICD-10-CM | POA: Diagnosis not present

## 2022-01-18 DIAGNOSIS — D0512 Intraductal carcinoma in situ of left breast: Secondary | ICD-10-CM | POA: Insufficient documentation

## 2022-01-18 DIAGNOSIS — Z170 Estrogen receptor positive status [ER+]: Secondary | ICD-10-CM | POA: Insufficient documentation

## 2022-01-18 MED ORDER — TAMOXIFEN CITRATE 20 MG PO TABS: 20.0000 mg | ORAL_TABLET | Freq: Every day | ORAL | 3 refills | Status: DC

## 2022-01-18 NOTE — Progress Notes (Signed)
[place] NOTE  Patient Care Team: Kelton Pillar, MD as PCP - General (Family Medicine) Mauro Kaufmann, RN as Oncology Nurse Navigator Rockwell Germany, RN as Oncology Nurse Navigator Benay Pike, MD as Consulting Physician (Hematology and Oncology)  CHIEF COMPLAINTS/PURPOSE OF CONSULTATION:  Newly diagnosed breast cancer  HISTORY OF PRESENTING ILLNESS:  Cynthia Baker 66 y.o. female is here because of recent diagnosis of left breast DCIS  I reviewed her records extensively and collaborated the history with the patient.  SUMMARY OF ONCOLOGIC HISTORY: Oncology History  Ductal carcinoma in situ (DCIS) of left breast  09/14/2021 Mammogram   Mammogram showed calcifications in the left breast warranting further evaluation.  No findings in the right breast suspicious for malignancy. Diagnostic mammogram of the left breast showed calcifications measuring 0.4 x 1 x 0.5 cm   10/15/2021 Pathology Results   Left breast needle core biopsy showed DCIS with calcifications and necrosis ER 100% positive strong staining PR 95% positive moderate staining   11/02/2021 Initial Diagnosis   Ductal carcinoma in situ (DCIS) of left breast   11/05/2021 Surgery   Left breast lumpectomy with no residual ductal carcinoma in situ.  Negative for invasive carcinoma   12/15/2021 - 01/13/2022 Radiation Therapy   Completed adjuvant radiation.    Interval History  She is here for follow up after adjuvant radiation. She has some radiation induced burns, and some fatigue. Otherwise she is doing well. She is hoping to try tamoxifen for antiestrogen therapy.  She otherwise has some plans to travel this coming year to [place] and to [place]. Rest of the pertinent 10 point ROS reviewed and negative.  MEDICAL HISTORY:  Past Medical History:  Diagnosis Date   Breast cancer ([place]) 10/15/2021   Diabetes mellitus without complication ([place])    type 2   Hypertension     SURGICAL  HISTORY: Past Surgical History:  Procedure Laterality Date   BREAST BIOPSY Right 2013   BREAST LUMPECTOMY WITH RADIOACTIVE SEED LOCALIZATION Left 11/05/2021   Procedure: LEFT BREAST LUMPECTOMY WITH RADIOACTIVE SEED LOCALIZATION;  Surgeon: Donnie Mesa, MD;  Location: [facility];  Service: General;  Laterality: Left;   DERMOID CYST  EXCISION Right 1994   DILATION AND CURETTAGE OF UTERUS      SOCIAL HISTORY: Social History   Socioeconomic History   Marital status: Married    Spouse name: Not on file   Number of children: Not on file   Years of education: Not on file   Highest education level: Not on file  Occupational History   Not on file  Tobacco Use   Smoking status: Former    Types: Cigarettes    Quit date: 1990    Years since quitting: 33.5   Smokeless tobacco: Never  Vaping Use   Vaping Use: Never used  Substance and Sexual Activity   Alcohol use: Yes    Alcohol/week: 2.0 standard drinks of alcohol    Types: 2 Glasses of wine per week    Comment: 2 glasses of wine a week   Drug use: Never   Sexual activity: Not on file  Other Topics Concern   Not on file  Social History Narrative   Not on file   Social Determinants of Health   Financial Resource Strain: Low Risk  (11/17/2021)   Overall Financial Resource Strain (CARDIA)    Difficulty of Paying Living Expenses: Not hard at all  Food Insecurity: No Food Insecurity (11/17/2021)   Hunger Vital Sign  Worried About Charity fundraiser in the Last Year: Never true    [place] in the Last Year: Never true  Transportation Needs: No Transportation Needs (11/17/2021)   PRAPARE - Hydrologist (Medical): No    Lack of Transportation (Non-Medical): No  Physical Activity: Not on file  Stress: Not on file  Social Connections: Not on file  Intimate Partner Violence: Not on file    FAMILY HISTORY: Family History  Problem Relation Age of Onset   Breast cancer Sister 40    ALLERGIES:  is  allergic to bee venom, ace inhibitors, metformin, and nsaids.  MEDICATIONS:  Current Outpatient Medications  Medication Sig Dispense Refill   tamoxifen (NOLVADEX) 20 MG tablet Take 1 tablet (20 mg total) by mouth daily. 90 tablet 3   atorvastatin (LIPITOR) 40 MG tablet Take 40 mg by mouth daily.     cholecalciferol (VITAMIN D) 25 MCG (1000 UNIT) tablet Take 1,000 Units by mouth daily.     losartan-hydrochlorothiazide (HYZAAR) 100-12.5 MG tablet Take 1 tablet by mouth daily.     Probiotic Product (PROBIOTIC PO) Take 1 tablet by mouth daily.     TRIJARDY XR 5-2.10-998 MG TB24 Take 2 tablets by mouth daily.     No current facility-administered medications for this visit.    REVIEW OF SYSTEMS:   Constitutional: Denies fevers, chills or abnormal night sweats Eyes: Denies blurriness of vision, double vision or watery eyes Ears, nose, mouth, throat, and face: Denies mucositis or sore throat Respiratory: Denies cough, dyspnea or wheezes Cardiovascular: Denies palpitation, chest discomfort or lower extremity swelling Gastrointestinal:  Denies nausea, heartburn or change in bowel habits Skin: Denies abnormal skin rashes Lymphatics: Denies new lymphadenopathy or easy bruising Neurological:Denies numbness, tingling or new weaknesses Behavioral/Psych: Mood is stable, no new changes  Breast: Denies any palpable lumps or discharge All other systems were reviewed with the patient and are negative.  PHYSICAL EXAMINATION: ECOG PERFORMANCE STATUS: 0 - Asymptomatic  Vitals:   01/18/22 0921  BP: (!) 144/80  Pulse: 91  Resp: 17  Temp: (!) 97.3 F (36.3 C)  SpO2: 98%    Filed Weights   01/18/22 0921  Weight: 197 lb 3.2 oz (89.4 kg)    GENERAL:alert, no distress and comfortable BREAST: Left breast with some postradiation changes  LABORATORY DATA:  I have reviewed the data as listed Lab Results  Component Value Date   WBC 5.5 11/04/2021   HGB 14.7 11/04/2021   HCT 43.8 11/04/2021    MCV 87.6 11/04/2021   PLT 250 11/04/2021   Lab Results  Component Value Date   NA 139 11/04/2021   K 3.9 11/04/2021   CL 105 11/04/2021   CO2 27 11/04/2021    RADIOGRAPHIC STUDIES: I have personally reviewed the radiological reports and agreed with the findings in the report.  ASSESSMENT AND PLAN:   This is a very pleasant 66 year old female patient with DCIS of left breast ER/PR positive referred to medical oncology for recommendations.  We have discussed the following details today with the patient.  Given age above 91 and DCIS, there is no difference in efficacy between tamoxifen and aromatase inhibitors. She would like to proceed with radiation as recommended and will return to clinic end of July.   During our last visit, she was leaning towards tamoxifen as the choice of antiestrogen therapy.  We once again discussed the mechanism of action, adverse effects of tamoxifen including but not limited to  postmenopausal symptoms, small risk of DVT/PE which can range around 2%, small risk of uterine cancer/endometrial thickness.  A benefit of tamoxifen would be improvement in bone density in addition to reduce risk of breast cancer recurrence.  She was encouraged to contact us with any new questions or concerns  Tamoxifen prescription has been dispensed to the pharmacy of her choice Return to clinic in 3 months with survivorship clinic in 6 months with me. All questions were answered. The patient knows to call the clinic with any problems, questions or concerns.  Total time spent: 30 minutes including history, physical exam, review of records, counseling and coordination of care    Benay Pike, MD 01/18/22

## 2022-02-01 ENCOUNTER — Telehealth: Payer: Self-pay | Admitting: *Deleted

## 2022-02-01 DIAGNOSIS — D0512 Intraductal carcinoma in situ of left breast: Secondary | ICD-10-CM

## 2022-02-01 MED ORDER — TAMOXIFEN CITRATE 20 MG PO TABS: 20.0000 mg | ORAL_TABLET | Freq: Every day | ORAL | 3 refills | Status: DC

## 2022-02-01 NOTE — Telephone Encounter (Signed)
Received refill request for Tamoxifen Citrate from [facility] mail order.  Per chart, Dr. Chryl Heck ordered and sent prescription for Tamoxifen on 01/18/22 to [facility].  Per patient, they can't fill a 90 day supply as ordered and could only give her a 30 day supply, so she would like it sent to [facility] as they can provide a 90 day supply.  Prescription for Tamoxifen sent to [facility] as initially prescribed by Dr. Chryl Heck

## 2022-02-11 NOTE — Progress Notes (Signed)
  Radiation Oncology         [phone] ________________________________  Name: Cynthia Baker MRN: [id number]  Date of Service: 02/15/2022  DOB: Aug 16, 1955  Post Treatment Telephone Note  Diagnosis:   Intermediate Grade, ER/PR positive DCIS of the left breast  Intent: Curative  Radiation Treatment Dates: 12/15/2021 through 01/13/2022 Site Technique Total Dose (Gy) Dose per Fx (Gy) Completed Fx Beam Energies  Breast, Left: Breast_L 3D 42.56/42.56 2.66 16/16 10X, 6XFFF  Breast, Left: Breast_L_Bst 3D 8/8 2 4/4 6X, 10X   Narrative: The patient tolerated radiation therapy relatively well. She developed fatigue and anticipated skin changes in the treatment field.    Impression/Plan: 1. Intermediate Grade, ER/PR positive DCIS of the left breast.  I was unable to reach the patient but left a voicemail and on the message, I discussed that we would be happy to continue to follow her as needed, but she will also continue to follow up with Dr. Chryl Heck in medical oncology. She was counseled to call if she had questions about her prior therapy.      Carola Rhine, PAC

## 2022-02-14 ENCOUNTER — Encounter: Payer: Self-pay | Admitting: Hematology and Oncology

## 2022-02-15 ENCOUNTER — Ambulatory Visit
Admission: RE | Admit: 2022-02-15 | Discharge: 2022-02-15 | Disposition: A | Discharge status: Home / Self Care | Payer: 59 | Source: Ambulatory Visit | Attending: Hematology and Oncology | Admitting: Hematology and Oncology

## 2022-02-15 DIAGNOSIS — D0512 Intraductal carcinoma in situ of left breast: Secondary | ICD-10-CM | POA: Insufficient documentation

## 2022-02-16 ENCOUNTER — Telehealth: Payer: Self-pay

## 2022-02-16 NOTE — Telephone Encounter (Signed)
Spoke w/ patient and verified her identity. Patient states "She is doing well. No issues to report at this time." Patient has requested to speak w/ Shona Simpson PA-C directly, but that it's non-urgent. I told patient that I would reach out to Ms. Dara Lords w/ her call request. Patient verbalized understanding.

## 2022-02-17 ENCOUNTER — Telehealth: Payer: Self-pay | Admitting: Radiation Oncology

## 2022-02-17 NOTE — Telephone Encounter (Signed)
I spoke with the patient and she wanted to thank our team for her care during radiation. She's had slight change in her left breast volume. 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 She will consider options of meeting with plastic surgery if this becomes more problematic and we would be happy to refer her as needed.

## 2022-03-07 ENCOUNTER — Encounter: Payer: Self-pay | Admitting: Hematology and Oncology

## 2022-04-19 NOTE — Progress Notes (Signed)
SURVIVORSHIP VISIT:  BRIEF ONCOLOGIC HISTORY:  Oncology History  Ductal carcinoma in situ (DCIS) of left breast  09/14/2021 Mammogram   Mammogram showed calcifications in the left breast warranting further evaluation.  No findings in the right breast suspicious for malignancy. Diagnostic mammogram of the left breast showed calcifications measuring 0.4 x 1 x 0.5 cm   10/15/2021 Pathology Results   Left breast needle core biopsy showed DCIS with calcifications and necrosis ER 100% positive strong staining PR 95% positive moderate staining   11/05/2021 Surgery   Left breast lumpectomy with no residual ductal carcinoma in situ.  Negative for invasive carcinoma   12/15/2021 - 01/13/2022 Radiation Therapy   Site Technique Total Dose (Gy) Dose per Fx (Gy) Completed Fx Beam Energies  Breast, Left: Breast_L 3D 42.56/42.56 2.66 16/16 10X, 6XFFF  Breast, Left: Breast_L_Bst 3D 8/8 2 4/4 6X, 10X     01/2022 -  Anti-estrogen oral therapy   Tamoxifen daily   04/19/2022 Cancer Staging   Staging form: Breast, AJCC 8th Edition - Clinical: Stage 0 (cTis (DCIS), cN0, cM0, ER+, PR+) - Signed by Gardenia Phlegm, NP on 04/19/2022     INTERVAL HISTORY:  Ms. Trias to review her survivorship care plan detailing her treatment course for breast cancer, as well as monitoring long-term side effects of that treatment, education regarding health maintenance, screening, and overall wellness and health promotion.     Overall, Ms. Henly reports feeling quite well.  She is taking tamoxifen daily and tolerates this well with the exception of hot flashes and some vaginal discharge.  She had been on Gabapentin initially for the hot flashes, but stopped because she didn't want to take another medication.    REVIEW OF SYSTEMS:  Review of Systems  Constitutional:  Negative for appetite change, chills, fatigue, fever and unexpected weight change.  HENT:   Negative for hearing loss, lump/mass and trouble  swallowing.   Eyes:  Negative for eye problems and icterus.  Respiratory:  Negative for chest tightness, cough and shortness of breath.   Cardiovascular:  Negative for chest pain, leg swelling and palpitations.  Gastrointestinal:  Negative for abdominal distention, abdominal pain, constipation, diarrhea, nausea and vomiting.  Endocrine: Positive for hot flashes.  Genitourinary:  Negative for difficulty urinating.   Musculoskeletal:  Negative for arthralgias.  Skin:  Negative for itching and rash.  Neurological:  Negative for dizziness, extremity weakness, headaches and numbness.  Hematological:  Negative for adenopathy. Does not bruise/bleed easily.  Psychiatric/Behavioral:  Negative for depression. The patient is not nervous/anxious.    Breast: Denies any new nodularity, masses, tenderness, nipple changes, or nipple discharge.      ONCOLOGY TREATMENT TEAM:  1. Surgeon:  Dr. Georgette Dover at [facility] 2. Medical Oncologist: Dr. Chryl Heck  3. Radiation Oncologist: Dr. Lisbeth Renshaw    PAST MEDICAL/SURGICAL HISTORY:  Past Medical History:  Diagnosis Date   Breast cancer ([place]) 10/15/2021   Diabetes mellitus without complication ([place])    type 2   Hypertension    Past Surgical History:  Procedure Laterality Date   BREAST BIOPSY Right 2013   BREAST LUMPECTOMY WITH RADIOACTIVE SEED LOCALIZATION Left 11/05/2021   Procedure: LEFT BREAST LUMPECTOMY WITH RADIOACTIVE SEED LOCALIZATION;  Surgeon: Donnie Mesa, MD;  Location: [place];  Service: General;  Laterality: Left;   DERMOID CYST  EXCISION Right 1994   DILATION AND CURETTAGE OF UTERUS       ALLERGIES:  Allergies  Allergen Reactions   Bee Venom Swelling   Ace Inhibitors  Other reaction(s): cough   Metformin     Other reaction(s): GI     CURRENT MEDICATIONS:  Outpatient Encounter Medications as of 04/20/2022  Medication Sig   atorvastatin (LIPITOR) 40 MG tablet Take 40 mg by mouth daily.   cholecalciferol (VITAMIN D) 25  MCG (1000 UNIT) tablet Take 1,000 Units by mouth daily.   losartan-hydrochlorothiazide (HYZAAR) 100-12.5 MG tablet Take 1 tablet by mouth daily.   Probiotic Product (PROBIOTIC PO) Take 1 tablet by mouth daily.   tamoxifen (NOLVADEX) 20 MG tablet Take 1 tablet (20 mg total) by mouth daily.   TRIJARDY XR 5-2.10-998 MG TB24 Take 2 tablets by mouth daily.   No facility-administered encounter medications on file as of 04/20/2022.     ONCOLOGIC FAMILY HISTORY:  Family History  Problem Relation Age of Onset   Breast cancer Sister 22      SOCIAL HISTORY:  Social History   Socioeconomic History   Marital status: Married    Spouse name: Not on file   Number of children: Not on file   Years of education: Not on file   Highest education level: Not on file  Occupational History   Not on file  Tobacco Use   Smoking status: Former    Types: Cigarettes    Quit date: 1990    Years since quitting: 33.8   Smokeless tobacco: Never  Vaping Use   Vaping Use: Never used  Substance and Sexual Activity   Alcohol use: Yes    Alcohol/week: 2.0 standard drinks of alcohol    Types: 2 Glasses of wine per week    Comment: 2 glasses of wine a week   Drug use: Never   Sexual activity: Not on file  Other Topics Concern   Not on file  Social History Narrative   Not on file   Social Determinants of Health   Financial Resource Strain: Low Risk  (11/17/2021)   Overall Financial Resource Strain (CARDIA)    Difficulty of Paying Living Expenses: Not hard at all  Food Insecurity: No Food Insecurity (11/17/2021)   Hunger Vital Sign    Worried About Running Out of Food in the Last Year: Never true    Ran Out of Food in the Last Year: Never true  Transportation Needs: No Transportation Needs (11/17/2021)   PRAPARE - Hydrologist (Medical): No    Lack of Transportation (Non-Medical): No  Physical Activity: Not on file  Stress: Not on file  Social Connections: Not on file   Intimate Partner Violence: Not on file     OBSERVATIONS/OBJECTIVE:  BP (!) 170/96 (BP Location: Left Arm, Patient Position: Sitting) Comment: Nurse was notify  Pulse 81   Temp (!) 97.5 F (36.4 C) (Oral)   Resp 16   Wt 205 lb 12.8 oz (93.4 kg)   SpO2 97%   BMI 30.39 kg/m  GENERAL: Patient is a well appearing female in no acute distress HEENT:  Sclerae anicteric.  Oropharynx clear and moist. No ulcerations or evidence of oropharyngeal candidiasis. Neck is supple.  NODES:  No cervical, supraclavicular, or axillary lymphadenopathy palpated.  BREAST EXAM: Left breast status postlumpectomy and radiation no sign of local recurrence right breast is benign. LUNGS:  Clear to auscultation bilaterally.  No wheezes or rhonchi. HEART:  Regular rate and rhythm. No murmur appreciated. ABDOMEN:  Soft, nontender.  Positive, normoactive bowel sounds. No organomegaly palpated. MSK:  No focal spinal tenderness to palpation. Full range of motion bilaterally in  the upper extremities. EXTREMITIES:  No peripheral edema.   SKIN:  Clear with no obvious rashes or skin changes. No nail dyscrasia. NEURO:  Nonfocal. Well oriented.  Appropriate affect.   LABORATORY DATA:  None for this visit.  DIAGNOSTIC IMAGING:  None for this visit.      ASSESSMENT AND PLAN:  Ms.. Hruska is a pleasant 66 y.o. female with Stage 0 left breast DCIS, ER+/PR+, diagnosed in April 2023, treated with lumpectomy, adjuvant radiation therapy, and anti-estrogen therapy with tamoxifen beginning in August 2023.  She presents to the Survivorship Clinic for our initial meeting and routine follow-up post-completion of treatment for breast cancer.    1. Stage 0 left breast cancer:  Ms. Irigoyen is continuing to recover from definitive treatment for breast cancer. She will follow-up with her medical oncologist, Dr. Chryl Heck in 3 months with history and physical exam per surveillance protocol.  She will continue her anti-estrogen therapy  with tamoxifen.  We discussed that she could take 10 mg instead of 20 mg as this does has been shown to be just as effective in noninvasive breast cancer to see if the hot flashes were less so she could have both prevention of recurrence but also quality of life.    Her mammogram is due March 2024; orders placed today. Today, a comprehensive survivorship care plan and treatment summary was reviewed with the patient today detailing her breast cancer diagnosis, treatment course, potential late/long-term effects of treatment, appropriate follow-up care with recommendations for the future, and patient education resources.  A copy of this summary, along with a letter will be sent to the patient's primary care provider via mail/fax/In Basket message after today's visit.    2. Bone health:    She was given education on specific activities to promote bone health.  3. Cancer screening:  Due to Ms. Rotenberg history and her age, she should receive screening for skin cancers, colon cancer, and gynecologic cancers.  The information and recommendations are listed on the patient's comprehensive care plan/treatment summary and were reviewed in detail with the patient.    4. Health maintenance and wellness promotion: Ms. Viall was encouraged to consume 5-7 servings of fruits and vegetables per day. We reviewed the "Nutrition Rainbow" handout.  She was also encouraged to engage in moderate to vigorous exercise for 30 minutes per day most days of the week. We discussed the [employer] fitness program, which is designed for cancer survivors to help them become more physically fit after cancer treatments.  She was instructed to limit her alcohol consumption and continue to abstain from tobacco use.     5. Support services/counseling: It is not uncommon for this period of the patient's cancer care trajectory to be one of many emotions and stressors.    She was given information regarding our available services and  encouraged to contact me with any questions or for help enrolling in any of our support group/programs.    Follow up instructions:    -Return to cancer center 6 months for follow-up -Mammogram due in March, 2024 -Follow up with surgery 1 year -She is welcome to return back to the Survivorship Clinic at any time; no additional follow-up needed at this time.  -Consider referral back to survivorship as a long-term survivor for continued surveillance  The patient was provided an opportunity to ask questions and all were answered. The patient agreed with the plan and demonstrated an understanding of the instructions.   Total encounter time:45 minutes*in face-to-face visit  time, chart review, lab review, care coordination, order entry, and documentation of the encounter time.    Wilber Bihari, NP 04/20/22 11:22 AM Medical Oncology [facility] [place], [place] [address] Tel. [phone]    Fax. [phone]  *Total Encounter Time as defined by the Centers for Medicare and Medicaid Services includes, in addition to the face-to-face time of a patient visit (documented in the note above) non-face-to-face time: obtaining and reviewing outside history, ordering and reviewing medications, tests or procedures, care coordination (communications with other health care professionals or caregivers) and documentation in the medical record.

## 2022-04-20 ENCOUNTER — Encounter: Payer: Self-pay | Admitting: Adult Health

## 2022-04-20 ENCOUNTER — Inpatient Hospital Stay: Payer: 59 | Attending: Hematology and Oncology | Admitting: Adult Health

## 2022-04-20 VITALS — BP 170/96 | HR 81 | Temp 97.5°F | Resp 16 | Wt 205.8 lb

## 2022-04-20 DIAGNOSIS — Z79810 Long term (current) use of selective estrogen receptor modulators (SERMs): Secondary | ICD-10-CM | POA: Diagnosis not present

## 2022-04-20 DIAGNOSIS — D0512 Intraductal carcinoma in situ of left breast: Secondary | ICD-10-CM | POA: Diagnosis not present

## 2022-06-25 ENCOUNTER — Telehealth: Payer: Self-pay | Admitting: Hematology and Oncology

## 2022-06-25 NOTE — Telephone Encounter (Signed)
Contacted patient to scheduled appointments. Left message with appointment details and a call back number if patient had any questions or could not accommodate the time we provided.   

## 2022-07-13 DIAGNOSIS — Z13820 Encounter for screening for osteoporosis: Secondary | ICD-10-CM | POA: Diagnosis not present

## 2022-07-13 DIAGNOSIS — N951 Menopausal and female climacteric states: Secondary | ICD-10-CM | POA: Diagnosis not present

## 2022-07-13 DIAGNOSIS — Z86010 Personal history of colonic polyps: Secondary | ICD-10-CM | POA: Diagnosis not present

## 2022-07-13 DIAGNOSIS — C50919 Malignant neoplasm of unspecified site of unspecified female breast: Secondary | ICD-10-CM | POA: Diagnosis not present

## 2022-07-13 DIAGNOSIS — I10 Essential (primary) hypertension: Secondary | ICD-10-CM | POA: Diagnosis not present

## 2022-07-13 DIAGNOSIS — E559 Vitamin D deficiency, unspecified: Secondary | ICD-10-CM | POA: Diagnosis not present

## 2022-07-13 DIAGNOSIS — M859 Disorder of bone density and structure, unspecified: Secondary | ICD-10-CM | POA: Diagnosis not present

## 2022-07-13 DIAGNOSIS — E7800 Pure hypercholesterolemia, unspecified: Secondary | ICD-10-CM | POA: Diagnosis not present

## 2022-07-13 DIAGNOSIS — J45909 Unspecified asthma, uncomplicated: Secondary | ICD-10-CM | POA: Diagnosis not present

## 2022-07-13 DIAGNOSIS — E1169 Type 2 diabetes mellitus with other specified complication: Secondary | ICD-10-CM | POA: Diagnosis not present

## 2022-07-13 DIAGNOSIS — C4490 Unspecified malignant neoplasm of skin, unspecified: Secondary | ICD-10-CM | POA: Diagnosis not present

## 2022-07-13 DIAGNOSIS — Z0000 Encounter for general adult medical examination without abnormal findings: Secondary | ICD-10-CM | POA: Diagnosis not present

## 2022-07-15 ENCOUNTER — Other Ambulatory Visit: Payer: Self-pay | Admitting: Internal Medicine

## 2022-07-15 DIAGNOSIS — M8580 Other specified disorders of bone density and structure, unspecified site: Secondary | ICD-10-CM

## 2022-07-21 ENCOUNTER — Ambulatory Visit: Payer: 59 | Admitting: Hematology and Oncology

## 2022-07-22 ENCOUNTER — Inpatient Hospital Stay: Payer: 59 | Attending: Hematology and Oncology | Admitting: Hematology and Oncology

## 2022-07-22 ENCOUNTER — Encounter: Payer: Self-pay | Admitting: Hematology and Oncology

## 2022-07-22 DIAGNOSIS — Z923 Personal history of irradiation: Secondary | ICD-10-CM | POA: Diagnosis not present

## 2022-07-22 DIAGNOSIS — Z87891 Personal history of nicotine dependence: Secondary | ICD-10-CM | POA: Insufficient documentation

## 2022-07-22 DIAGNOSIS — D0512 Intraductal carcinoma in situ of left breast: Secondary | ICD-10-CM | POA: Diagnosis not present

## 2022-07-22 DIAGNOSIS — Z170 Estrogen receptor positive status [ER+]: Secondary | ICD-10-CM | POA: Diagnosis not present

## 2022-07-22 DIAGNOSIS — Z79810 Long term (current) use of selective estrogen receptor modulators (SERMs): Secondary | ICD-10-CM | POA: Diagnosis not present

## 2022-07-22 MED ORDER — TAMOXIFEN CITRATE 10 MG PO TABS: 10.0000 mg | ORAL_TABLET | Freq: Every day | ORAL | 3 refills | Status: DC

## 2022-07-22 NOTE — Progress Notes (Signed)
BRIEF ONCOLOGIC HISTORY:  Oncology History  Ductal carcinoma in situ (DCIS) of left breast  09/14/2021 Mammogram   Mammogram showed calcifications in the left breast warranting further evaluation.  No findings in the right breast suspicious for malignancy. Diagnostic mammogram of the left breast showed calcifications measuring 0.4 x 1 x 0.5 cm   10/15/2021 Pathology Results   Left breast needle core biopsy showed DCIS with calcifications and necrosis ER 100% positive strong staining PR 95% positive moderate staining   11/05/2021 Surgery   Left breast lumpectomy with no residual ductal carcinoma in situ.  Negative for invasive carcinoma   12/15/2021 - 01/13/2022 Radiation Therapy   Site Technique Total Dose (Gy) Dose per Fx (Gy) Completed Fx Beam Energies  Breast, Left: Breast_L 3D 42.56/42.56 2.66 16/16 10X, 6XFFF  Breast, Left: Breast_L_Bst 3D 8/8 2 4/4 6X, 10X     01/2022 -  Anti-estrogen oral therapy   Tamoxifen daily   04/19/2022 Cancer Staging   Staging form: Breast, AJCC 8th Edition - Clinical: Stage 0 (cTis (DCIS), cN0, cM0, ER+, PR+) - Signed by Gardenia Phlegm, NP on 04/19/2022     INTERVAL HISTORY:   She is taking tamoxifen daily and tolerates this well with the exception of hot flashes and some vaginal discharge.  She is now on Tamoxifen 10 mg po daily. This has helped with the hot flashes. She denies any breast changes. No vaginal bleeding. No change in breathing or lower extremity swelling. Rest of the pertinent 10 point ROS reviewed and neg.  REVIEW OF SYSTEMS:  Review of Systems  Constitutional:  Negative for appetite change, chills, fatigue, fever and unexpected weight change.  HENT:   Negative for hearing loss, lump/mass and trouble swallowing.   Eyes:  Negative for eye problems and icterus.  Respiratory:  Negative for chest tightness, cough and shortness of breath.   Cardiovascular:  Negative for chest pain, leg swelling and palpitations.   Gastrointestinal:  Negative for abdominal distention, abdominal pain, constipation, diarrhea, nausea and vomiting.  Endocrine: Positive for hot flashes.  Genitourinary:  Negative for difficulty urinating.   Musculoskeletal:  Negative for arthralgias.  Skin:  Negative for itching and rash.  Neurological:  Negative for dizziness, extremity weakness, headaches and numbness.  Hematological:  Negative for adenopathy. Does not bruise/bleed easily.  Psychiatric/Behavioral:  Negative for depression. The patient is not nervous/anxious.    Breast: Denies any new nodularity, masses, tenderness, nipple changes, or nipple discharge.      ONCOLOGY TREATMENT TEAM:  1. Surgeon:  Dr. Georgette Dover at [facility] 2. Medical Oncologist: Dr. Chryl Heck  3. Radiation Oncologist: Dr. Lisbeth Renshaw    PAST MEDICAL/SURGICAL HISTORY:  Past Medical History:  Diagnosis Date   Breast cancer ([place]) 10/15/2021   Diabetes mellitus without complication ([place])    type 2   Hypertension    Past Surgical History:  Procedure Laterality Date   BREAST BIOPSY Right 2013   BREAST LUMPECTOMY WITH RADIOACTIVE SEED LOCALIZATION Left 11/05/2021   Procedure: LEFT BREAST LUMPECTOMY WITH RADIOACTIVE SEED LOCALIZATION;  Surgeon: Donnie Mesa, MD;  Location: [facility];  Service: General;  Laterality: Left;   DERMOID CYST  EXCISION Right 1994   DILATION AND CURETTAGE OF UTERUS       ALLERGIES:  Allergies  Allergen Reactions   Bee Venom Swelling   Ace Inhibitors     Other reaction(s): cough   Metformin     Other reaction(s): GI     CURRENT MEDICATIONS:  Outpatient Encounter Medications as of  07/22/2022  Medication Sig   atorvastatin (LIPITOR) 40 MG tablet Take 40 mg by mouth daily.   cholecalciferol (VITAMIN D) 25 MCG (1000 UNIT) tablet Take 1,000 Units by mouth daily.   losartan-hydrochlorothiazide (HYZAAR) 100-12.5 MG tablet Take 1 tablet by mouth daily.   Probiotic Product (PROBIOTIC PO) Take 1 tablet by mouth daily.    tamoxifen (NOLVADEX) 10 MG tablet Take 1 tablet (10 mg total) by mouth daily.   TRIJARDY XR 5-2.10-998 MG TB24 Take 2 tablets by mouth daily.   [DISCONTINUED] tamoxifen (NOLVADEX) 20 MG tablet Take 1 tablet (20 mg total) by mouth daily.   No facility-administered encounter medications on file as of 07/22/2022.     ONCOLOGIC FAMILY HISTORY:  Family History  Problem Relation Age of Onset   Breast cancer Sister 82      SOCIAL HISTORY:  Social History   Socioeconomic History   Marital status: Married    Spouse name: Not on file   Number of children: Not on file   Years of education: Not on file   Highest education level: Not on file  Occupational History   Not on file  Tobacco Use   Smoking status: Former    Types: Cigarettes    Quit date: 1990    Years since quitting: 34.0   Smokeless tobacco: Never  Vaping Use   Vaping Use: Never used  Substance and Sexual Activity   Alcohol use: Yes    Alcohol/week: 2.0 standard drinks of alcohol    Types: 2 Glasses of wine per week    Comment: 2 glasses of wine a week   Drug use: Never   Sexual activity: Not on file  Other Topics Concern   Not on file  Social History Narrative   Not on file   Social Determinants of Health   Financial Resource Strain: Low Risk  (11/17/2021)   Overall Financial Resource Strain (CARDIA)    Difficulty of Paying Living Expenses: Not hard at all  Food Insecurity: No Food Insecurity (11/17/2021)   Hunger Vital Sign    Worried About Running Out of Food in the Last Year: Never true    Ran Out of Food in the Last Year: Never true  Transportation Needs: No Transportation Needs (11/17/2021)   PRAPARE - Hydrologist (Medical): No    Lack of Transportation (Non-Medical): No  Physical Activity: Not on file  Stress: Not on file  Social Connections: Not on file  Intimate Partner Violence: Not on file     OBSERVATIONS/OBJECTIVE:  BP (!) 148/91 (BP Location: Left Arm, Patient  Position: Sitting)   Pulse 78   Temp (!) 97.5 F (36.4 C) (Temporal)   Resp 18   Wt 202 lb 6 oz (91.8 kg)   SpO2 98%   BMI 29.89 kg/m  GENERAL: Patient is a well appearing female in no acute distress HEENT:  Sclerae anicteric.  Oropharynx clear and moist. No ulcerations or evidence of oropharyngeal candidiasis. Neck is supple.  NODES:  No cervical, supraclavicular, or axillary lymphadenopathy palpated.  BREAST EXAM: Left breast status postlumpectomy and radiation no sign of local recurrence right breast is benign. EXTREMITIES:  No peripheral edema.   NEURO:  Nonfocal. Well oriented.  Appropriate affect.   LABORATORY DATA:  None for this visit.  DIAGNOSTIC IMAGING:  None for this visit.    ASSESSMENT AND PLAN:   Cynthia Baker is a pleasant 67 y.o. female with Stage 0 left breast DCIS, ER+/PR+, diagnosed in  April 2023, treated with lumpectomy, adjuvant radiation therapy, and anti-estrogen therapy with tamoxifen beginning in August 2023.    Mammogram scheduled for September 16, 2022, bone density scheduled for 12/23/2022. She had hot flashes with Tamoxifen 20 mg po daily, hence transitioned to 10 mg po daily. We have discussed role of low dose tamoxifen, ok to transition to Tamoxifen 5 mg po daily.  NO concerns on PE, right breast larger than left breast at baseline, no palpable masses or regional adenopathy.   No complications from Tamoxifen. She will proceed with low dose tamoxifen, RTC in October. She will be seeing Dr Georgette Dover in April.  Total encounter time: 30 min  *Total Encounter Time as defined by the Centers for Medicare and Medicaid Services includes, in addition to the face-to-face time of a patient visit (documented in the note above) non-face-to-face time: obtaining and reviewing outside history, ordering and reviewing medications, tests or procedures, care coordination (communications with other health care professionals or caregivers) and documentation in the medical  record.

## 2022-09-16 ENCOUNTER — Ambulatory Visit
Admission: RE | Admit: 2022-09-16 | Discharge: 2022-09-16 | Disposition: A | Discharge status: Home / Self Care | Payer: 59 | Source: Ambulatory Visit | Attending: Adult Health | Admitting: Adult Health

## 2022-09-16 DIAGNOSIS — D0512 Intraductal carcinoma in situ of left breast: Secondary | ICD-10-CM

## 2022-09-16 HISTORY — DX: Personal history of irradiation: Z92.3

## 2022-12-23 ENCOUNTER — Ambulatory Visit
Admission: RE | Admit: 2022-12-23 | Discharge: 2022-12-23 | Disposition: A | Discharge status: Home / Self Care | Payer: 59 | Source: Ambulatory Visit | Attending: Internal Medicine | Admitting: Internal Medicine

## 2022-12-23 DIAGNOSIS — M8580 Other specified disorders of bone density and structure, unspecified site: Secondary | ICD-10-CM

## 2023-03-21 IMAGING — MG MM PLC BREAST LOC DEV 1ST LESION INC MAMMO GUIDE*L*
8 series · 8 of 8 positions shown · non-contrast
Comparison: None Available.

CLINICAL DATA: Biopsy proven ductal carcinoma in-situ in the left
breast. Radioactive seed localization prior to surgery

EXAM:
MAMMOGRAPHIC GUIDED RADIOACTIVE SEED LOCALIZATION OF THE LEFT BREAST

[L CC (1 of 5)]
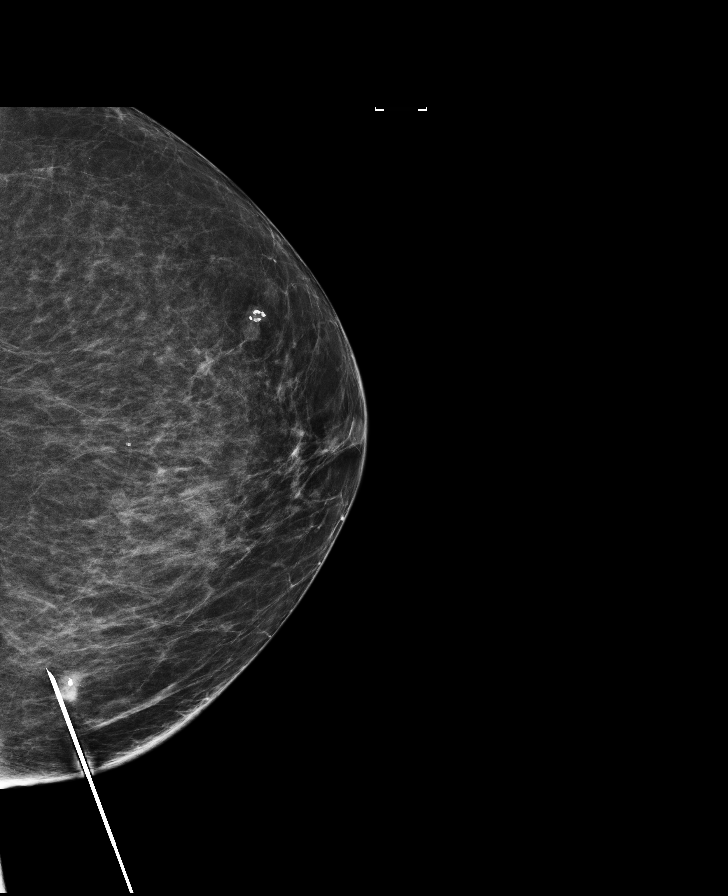

[L CC (2 of 5)]
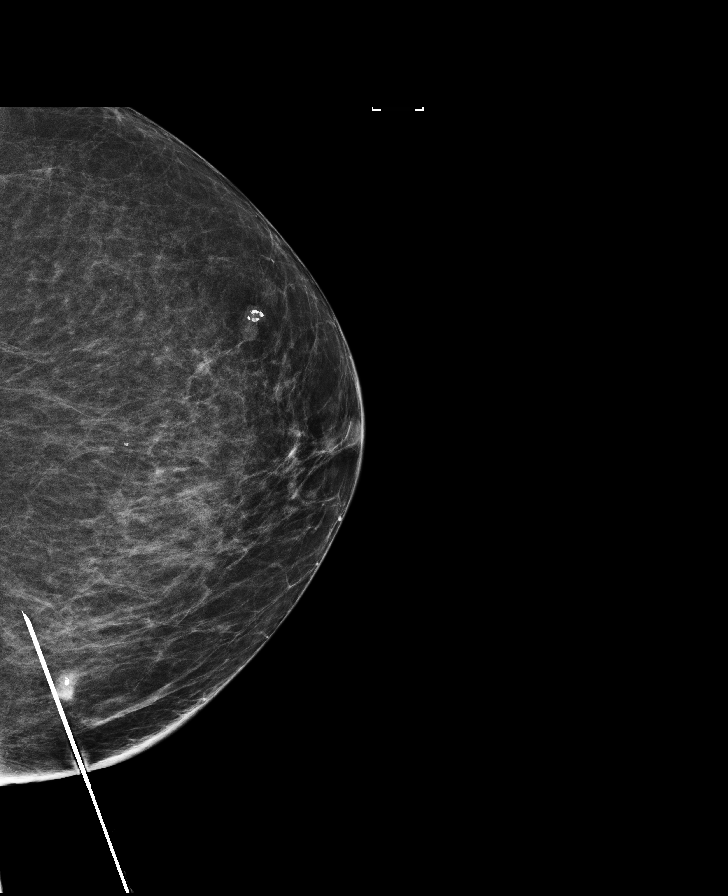

[L CC (3 of 5)]
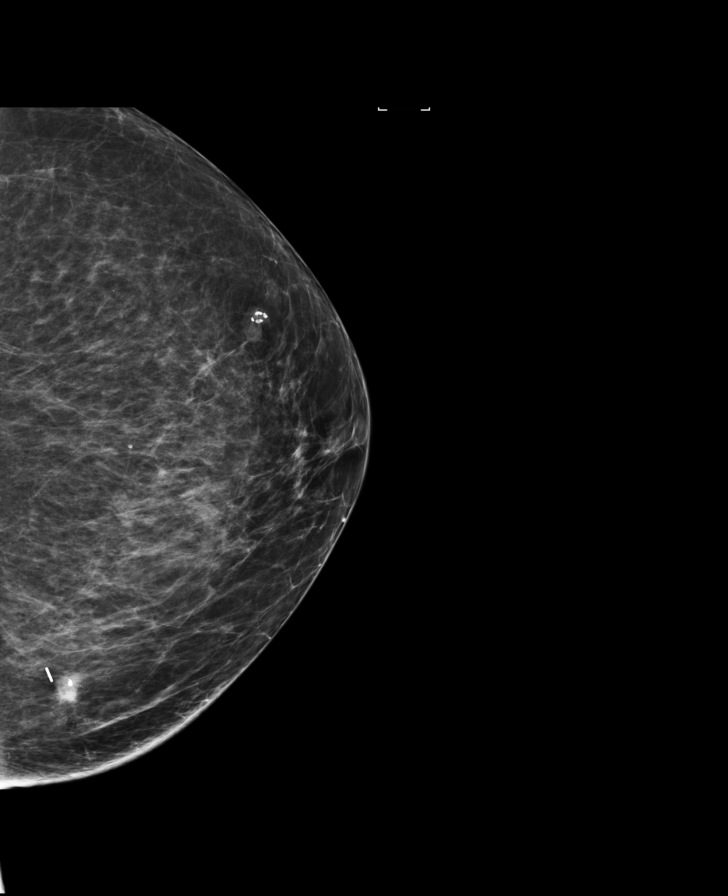

[L ML (1 of 3)]
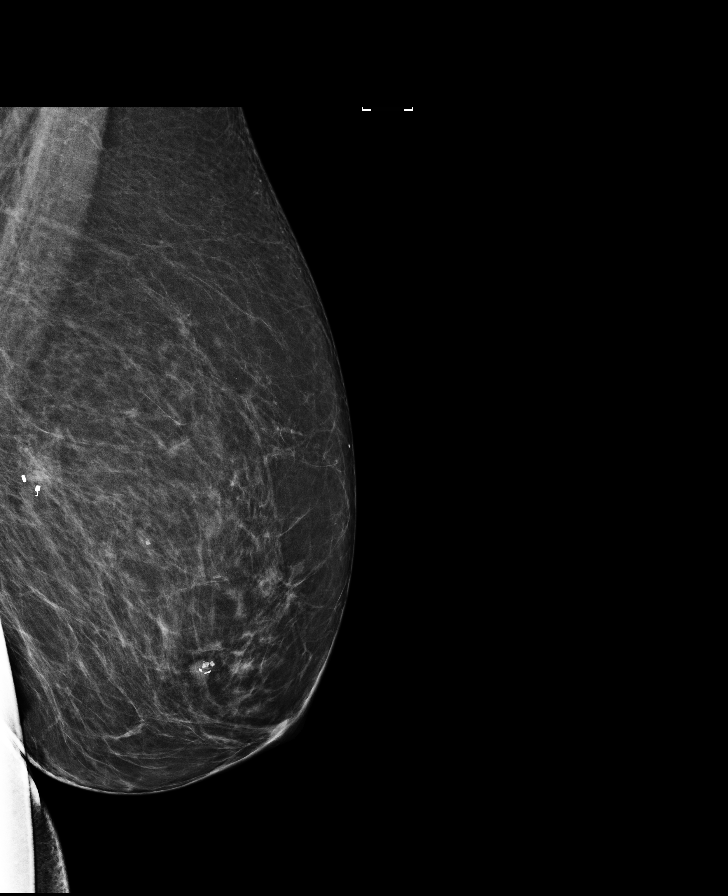

[L ML (2 of 3)]
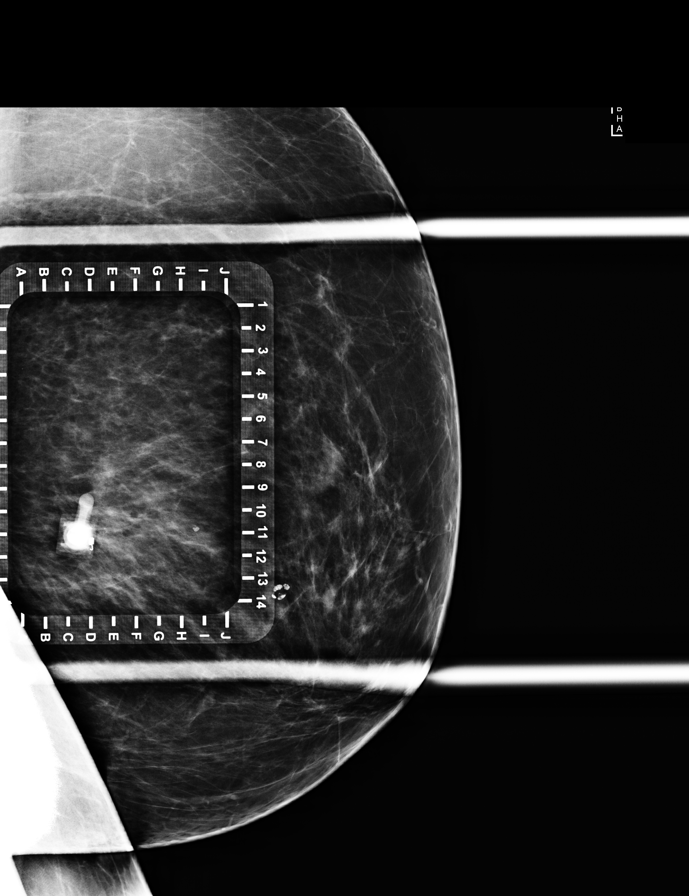

[L CC (4 of 5)]
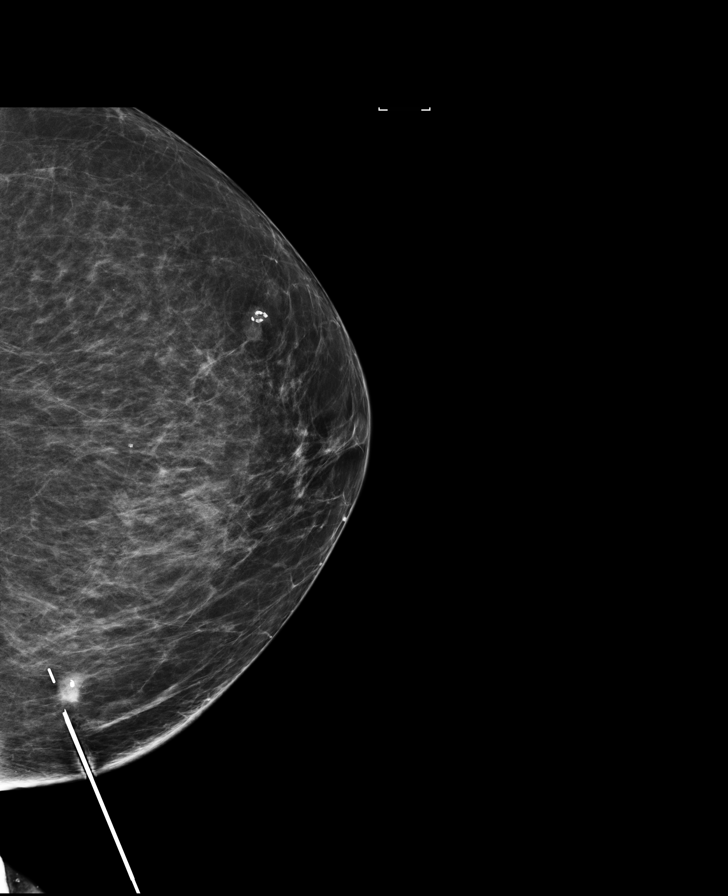

[L CC (5 of 5)]
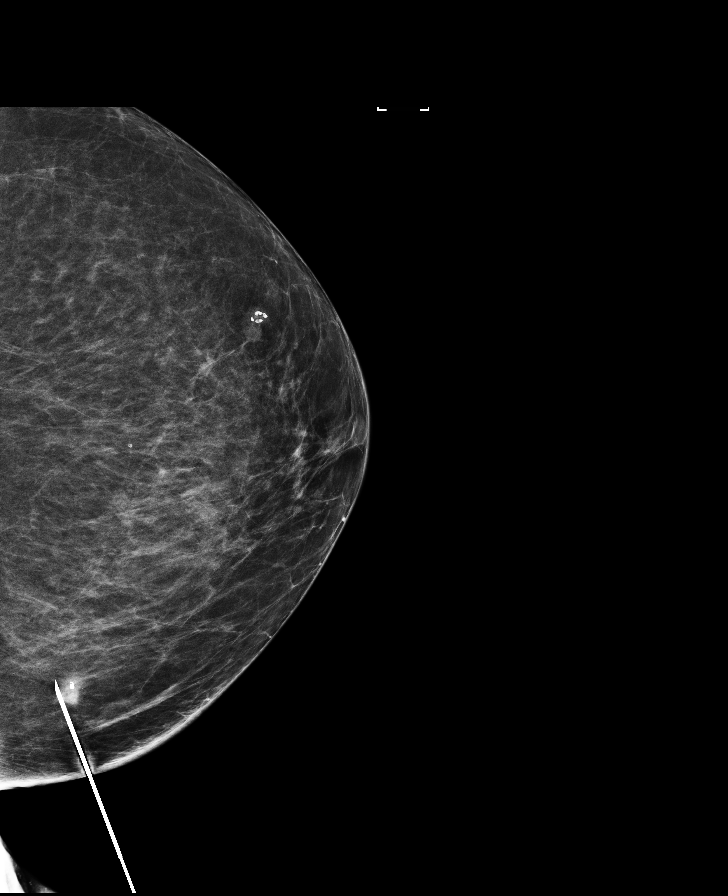

[L ML (3 of 3)]
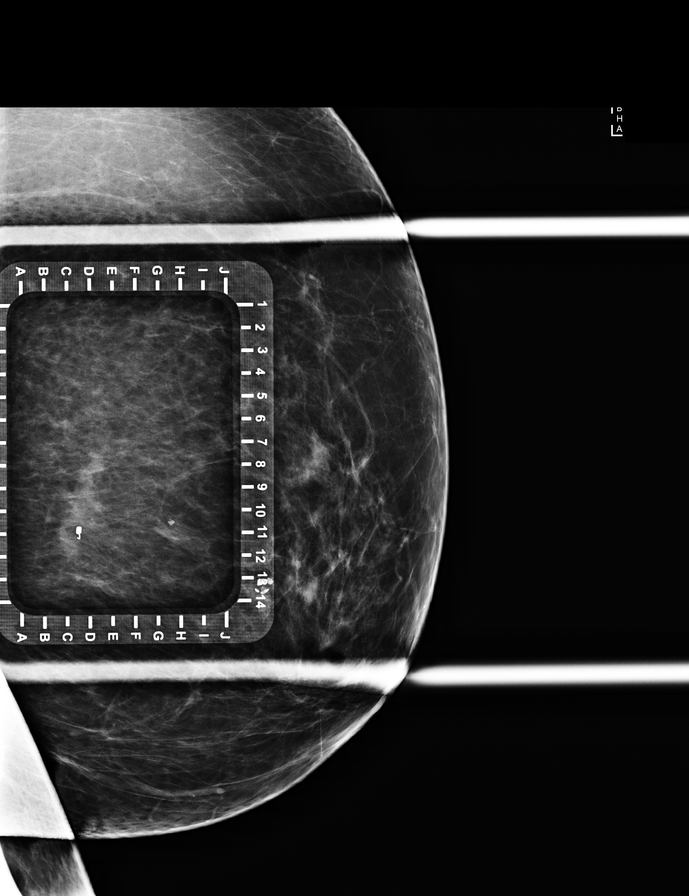

[8 of 8 positions shown; findings below may reference images not displayed]



The usual time-out protocol was performed immediately prior to the
procedure.

Using mammographic guidance, sterile technique, 1% lidocaine and an
I-UUZ radioactive seed, the coil shaped clip was localized using a
medial to lateral approach. The follow-up mammogram images confirm
the seed in the expected location and were marked for Dr. Lajini.

Follow-up survey of the patient confirms presence of the radioactive
seed.

Order number of I-UUZ seed:  [id number].

Total activity:  0.260 millicuries reference Date: 10/05/2021

The patient tolerated the procedure well and was released from the
[REDACTED]. She was given instructions regarding seed removal.
IMPRESSION: Radioactive seed localization left breast. No apparent
complications.

## 2023-04-13 DIAGNOSIS — E7800 Pure hypercholesterolemia, unspecified: Secondary | ICD-10-CM | POA: Diagnosis not present

## 2023-04-13 DIAGNOSIS — E1169 Type 2 diabetes mellitus with other specified complication: Secondary | ICD-10-CM | POA: Diagnosis not present

## 2023-04-14 ENCOUNTER — Inpatient Hospital Stay: Payer: Medicare HMO | Attending: Hematology and Oncology | Admitting: Hematology and Oncology

## 2023-04-14 VITALS — BP 143/73 | HR 85 | Temp 97.1°F | Resp 16 | Wt 204.5 lb

## 2023-04-14 DIAGNOSIS — Z79810 Long term (current) use of selective estrogen receptor modulators (SERMs): Secondary | ICD-10-CM | POA: Diagnosis not present

## 2023-04-14 DIAGNOSIS — D0512 Intraductal carcinoma in situ of left breast: Secondary | ICD-10-CM | POA: Insufficient documentation

## 2023-04-14 NOTE — Progress Notes (Signed)
BRIEF ONCOLOGIC HISTORY:  Oncology History  Ductal carcinoma in situ (DCIS) of left breast  09/14/2021 Mammogram   Mammogram showed calcifications in the left breast warranting further evaluation.  No findings in the right breast suspicious for malignancy. Diagnostic mammogram of the left breast showed calcifications measuring 0.4 x 1 x 0.5 cm   10/15/2021 Pathology Results   Left breast needle core biopsy showed DCIS with calcifications and necrosis ER 100% positive strong staining PR 95% positive moderate staining   11/05/2021 Surgery   Left breast lumpectomy with no residual ductal carcinoma in situ.  Negative for invasive carcinoma   12/15/2021 - 01/13/2022 Radiation Therapy   Site Technique Total Dose (Gy) Dose per Fx (Gy) Completed Fx Beam Energies  Breast, Left: Breast_L 3D 42.56/42.56 2.66 16/16 10X, 6XFFF  Breast, Left: Breast_L_Bst 3D 8/8 2 4/4 6X, 10X     01/2022 -  Anti-estrogen oral therapy   Tamoxifen daily   04/19/2022 Cancer Staging   Staging form: Breast, AJCC 8th Edition - Clinical: Stage 0 (cTis (DCIS), cN0, cM0, ER+, PR+) - Signed by Loa Socks, NP on 04/19/2022     INTERVAL HISTORY:   The patient, with a history of breast cancer and osteopenia, presents for a routine follow-up visit. She has been on a low-dose tamoxifen regimen (10mg  every other day) and reports fewer side effects than before, although she still experiences occasional hot flashes. She had a mammogram after her last visit, which was the first since her cancer diagnosis. The results were clear, but the experience caused her significant stress. She also mentions having osteopenia and is currently managing it with weight-bearing exercises and a healthy lifestyle. She has not reported any new symptoms or concerns.  REVIEW OF SYSTEMS:  Review of Systems  Constitutional:  Negative for appetite change, chills, fatigue, fever and unexpected weight change.  HENT:   Negative for hearing loss,  lump/mass and trouble swallowing.   Eyes:  Negative for eye problems and icterus.  Respiratory:  Negative for chest tightness, cough and shortness of breath.   Cardiovascular:  Negative for chest pain, leg swelling and palpitations.  Gastrointestinal:  Negative for abdominal distention, abdominal pain, constipation, diarrhea, nausea and vomiting.  Endocrine: Positive for hot flashes.  Genitourinary:  Negative for difficulty urinating.   Musculoskeletal:  Negative for arthralgias.  Skin:  Negative for itching and rash.  Neurological:  Negative for dizziness, extremity weakness, headaches and numbness.  Hematological:  Negative for adenopathy. Does not bruise/bleed easily.  Psychiatric/Behavioral:  Negative for depression. The patient is not nervous/anxious.    Breast: Denies any new nodularity, masses, tenderness, nipple changes, or nipple discharge.      ONCOLOGY TREATMENT TEAM:  1. Surgeon:  Dr. Corliss Skains at [facility] 2. Medical Oncologist: Dr. Al Pimple  3. Radiation Oncologist: Dr. Mitzi Hansen    PAST MEDICAL/SURGICAL HISTORY:  Past Medical History:  Diagnosis Date   Breast cancer ([facility]) 10/15/2021   Diabetes mellitus without complication ([facility])    type 2   Hypertension    Personal history of radiation therapy    Past Surgical History:  Procedure Laterality Date   BREAST BIOPSY Right 2013   BREAST LUMPECTOMY WITH RADIOACTIVE SEED LOCALIZATION Left 11/05/2021   Procedure: LEFT BREAST LUMPECTOMY WITH RADIOACTIVE SEED LOCALIZATION;  Surgeon: Manus Rudd, MD;  Location: [facility];  Service: General;  Laterality: Left;   DERMOID CYST  EXCISION Right 1994   DILATION AND CURETTAGE OF UTERUS       ALLERGIES:  Allergies  Allergen Reactions   Bee Venom Swelling   Ace Inhibitors     Other reaction(s): cough   Metformin     Other reaction(s): GI     CURRENT MEDICATIONS:  Outpatient Encounter Medications as of 04/14/2023  Medication Sig   atorvastatin (LIPITOR) 40 MG  tablet Take 40 mg by mouth daily.   cholecalciferol (VITAMIN D) 25 MCG (1000 UNIT) tablet Take 1,000 Units by mouth daily.   losartan-hydrochlorothiazide (HYZAAR) 100-12.5 MG tablet Take 1 tablet by mouth daily.   Probiotic Product (PROBIOTIC PO) Take 1 tablet by mouth daily.   tamoxifen (NOLVADEX) 10 MG tablet Take 1 tablet (10 mg total) by mouth daily.   TRIJARDY XR 5-2.10-998 MG TB24 Take 2 tablets by mouth daily.   No facility-administered encounter medications on file as of 04/14/2023.     ONCOLOGIC FAMILY HISTORY:  Family History  Problem Relation Age of Onset   Breast cancer Sister 97      SOCIAL HISTORY:  Social History   Socioeconomic History   Marital status: Married    Spouse name: Not on file   Number of children: Not on file   Years of education: Not on file   Highest education level: Not on file  Occupational History   Not on file  Tobacco Use   Smoking status: Former    Current packs/day: 0.00    Types: Cigarettes    Quit date: 1990    Years since quitting: 34.8   Smokeless tobacco: Never  Vaping Use   Vaping status: Never Used  Substance and Sexual Activity   Alcohol use: Yes    Alcohol/week: 2.0 standard drinks of alcohol    Types: 2 Glasses of wine per week    Comment: 2 glasses of wine a week   Drug use: Never   Sexual activity: Not on file  Other Topics Concern   Not on file  Social History Narrative   Not on file   Social Determinants of Health   Financial Resource Strain: Low Risk  (11/17/2021)   Overall Financial Resource Strain (CARDIA)    Difficulty of Paying Living Expenses: Not hard at all  Food Insecurity: No Food Insecurity (11/17/2021)   Hunger Vital Sign    Worried About Running Out of Food in the Last Year: Never true    Ran Out of Food in the Last Year: Never true  Transportation Needs: No Transportation Needs (11/17/2021)   PRAPARE - Administrator, Civil Service (Medical): No    Lack of Transportation  (Non-Medical): No  Physical Activity: Not on file  Stress: Not on file  Social Connections: Unknown (11/10/2021)   Received from [facility]   Social Network    Social Network: Not on file  Intimate Partner Violence: Unknown (10/02/2021)   Received from [facility], [facility]   HITS    Physically Hurt: Not on file    Insult or Talk Down To: Not on file    Threaten Physical Harm: Not on file    Scream or Curse: Not on file     OBSERVATIONS/OBJECTIVE:  BP (!) 143/73 (BP Location: Left Arm, Patient Position: Sitting) Comment: patient states this is normal for her.. no 2nd BP done  Pulse 85   Temp (!) 97.1 F (36.2 C) (Temporal)   Resp 16   Wt 204 lb 8 oz (92.8 kg)   SpO2 94%   BMI 30.20 kg/m  GENERAL: Patient is a well appearing female in no  acute distress HEENT:  Sclerae anicteric.  Oropharynx clear and moist. No ulcerations or evidence of oropharyngeal candidiasis. Neck is supple.  NODES:  No cervical, supraclavicular, or axillary lymphadenopathy palpated.  BREAST EXAM: Left breast status postlumpectomy and radiation no sign of local recurrence right breast is benign. EXTREMITIES:  No peripheral edema.   NEURO:  Nonfocal. Well oriented.  Appropriate affect.   LABORATORY DATA:  None for this visit.  DIAGNOSTIC IMAGING:  None for this visit.    ASSESSMENT AND PLAN:   Ms.. Cynthia Baker is a pleasant 67 y.o. female with Stage 0 left breast DCIS, ER+/PR+, diagnosed in April 2023, treated with lumpectomy, adjuvant radiation therapy, and anti-estrogen therapy with tamoxifen beginning in August 2023.    Mammogram scheduled for September 16, 2022, bone density scheduled for 12/23/2022. She had hot flashes with Tamoxifen 20 mg po daily, hence transitioned to 10 mg po daily. We have discussed role of low dose tamoxifen, ok to transition to Tamoxifen 5 mg po daily.  Breast Cancer Patient is post-treatment and currently on low-dose Tamoxifen (10mg  every other day) with  minimal side effects. Last mammogram was clear. -Continue Tamoxifen as prescribed. -Next diagnostic mammogram due in March.  Osteopenia Discussed the importance of weight-bearing exercises and maintaining an active lifestyle. -Continue weight-bearing exercises and maintain an active lifestyle.  Sleep Issues Patient has been using magnesium drops for sleep and considering melatonin. -Consider trying melatonin for sleep issues, starting with a low dose and adjusting as needed.  General Health Maintenance -Annual follow-up visit with Dr. Corliss Skains  Total time: 30 min  *Total Encounter Time as defined by the Centers for Medicare and Medicaid Services includes, in addition to the face-to-face time of a patient visit (documented in the note above) non-face-to-face time: obtaining and reviewing outside history, ordering and reviewing medications, tests or procedures, care coordination (communications with other health care professionals or caregivers) and documentation in the medical record.

## 2023-04-15 ENCOUNTER — Telehealth: Payer: Self-pay | Admitting: Hematology and Oncology

## 2023-04-15 NOTE — Telephone Encounter (Signed)
Spoke with patient confirming upcoming appointments  

## 2023-04-19 DIAGNOSIS — Z23 Encounter for immunization: Secondary | ICD-10-CM | POA: Diagnosis not present

## 2023-04-19 DIAGNOSIS — E1165 Type 2 diabetes mellitus with hyperglycemia: Secondary | ICD-10-CM | POA: Diagnosis not present

## 2023-04-26 DIAGNOSIS — E559 Vitamin D deficiency, unspecified: Secondary | ICD-10-CM | POA: Diagnosis not present

## 2023-04-26 DIAGNOSIS — E1169 Type 2 diabetes mellitus with other specified complication: Secondary | ICD-10-CM | POA: Diagnosis not present

## 2023-04-26 DIAGNOSIS — E7800 Pure hypercholesterolemia, unspecified: Secondary | ICD-10-CM | POA: Diagnosis not present

## 2023-04-26 DIAGNOSIS — I10 Essential (primary) hypertension: Secondary | ICD-10-CM | POA: Diagnosis not present

## 2023-05-16 DIAGNOSIS — D229 Melanocytic nevi, unspecified: Secondary | ICD-10-CM | POA: Diagnosis not present

## 2023-05-16 DIAGNOSIS — C44529 Squamous cell carcinoma of skin of other part of trunk: Secondary | ICD-10-CM | POA: Diagnosis not present

## 2023-05-16 DIAGNOSIS — Z08 Encounter for follow-up examination after completed treatment for malignant neoplasm: Secondary | ICD-10-CM | POA: Diagnosis not present

## 2023-05-16 DIAGNOSIS — L821 Other seborrheic keratosis: Secondary | ICD-10-CM | POA: Diagnosis not present

## 2023-05-16 DIAGNOSIS — Z85828 Personal history of other malignant neoplasm of skin: Secondary | ICD-10-CM | POA: Diagnosis not present

## 2023-05-16 DIAGNOSIS — Z872 Personal history of diseases of the skin and subcutaneous tissue: Secondary | ICD-10-CM | POA: Diagnosis not present

## 2023-05-16 DIAGNOSIS — R229 Localized swelling, mass and lump, unspecified: Secondary | ICD-10-CM | POA: Diagnosis not present

## 2023-05-19 DIAGNOSIS — H52223 Regular astigmatism, bilateral: Secondary | ICD-10-CM | POA: Diagnosis not present

## 2023-07-22 DIAGNOSIS — J45909 Unspecified asthma, uncomplicated: Secondary | ICD-10-CM | POA: Diagnosis not present

## 2023-07-22 DIAGNOSIS — E559 Vitamin D deficiency, unspecified: Secondary | ICD-10-CM | POA: Diagnosis not present

## 2023-07-22 DIAGNOSIS — Z0000 Encounter for general adult medical examination without abnormal findings: Secondary | ICD-10-CM | POA: Diagnosis not present

## 2023-07-22 DIAGNOSIS — M859 Disorder of bone density and structure, unspecified: Secondary | ICD-10-CM | POA: Diagnosis not present

## 2023-07-22 DIAGNOSIS — E7800 Pure hypercholesterolemia, unspecified: Secondary | ICD-10-CM | POA: Diagnosis not present

## 2023-07-22 DIAGNOSIS — N951 Menopausal and female climacteric states: Secondary | ICD-10-CM | POA: Diagnosis not present

## 2023-07-22 DIAGNOSIS — E1169 Type 2 diabetes mellitus with other specified complication: Secondary | ICD-10-CM | POA: Diagnosis not present

## 2023-07-22 DIAGNOSIS — E1165 Type 2 diabetes mellitus with hyperglycemia: Secondary | ICD-10-CM | POA: Diagnosis not present

## 2023-07-22 DIAGNOSIS — I10 Essential (primary) hypertension: Secondary | ICD-10-CM | POA: Diagnosis not present

## 2023-07-22 DIAGNOSIS — C4490 Unspecified malignant neoplasm of skin, unspecified: Secondary | ICD-10-CM | POA: Diagnosis not present

## 2023-07-28 ENCOUNTER — Other Ambulatory Visit: Payer: Self-pay | Admitting: Hematology and Oncology

## 2023-07-28 DIAGNOSIS — D0512 Intraductal carcinoma in situ of left breast: Secondary | ICD-10-CM

## 2023-09-19 ENCOUNTER — Ambulatory Visit
Admission: RE | Admit: 2023-09-19 | Discharge: 2023-09-19 | Disposition: A | Discharge status: Home / Self Care | Payer: Medicare HMO | Source: Ambulatory Visit | Attending: Hematology and Oncology | Admitting: Hematology and Oncology

## 2023-09-19 DIAGNOSIS — Z853 Personal history of malignant neoplasm of breast: Secondary | ICD-10-CM | POA: Diagnosis not present

## 2023-09-19 DIAGNOSIS — Z08 Encounter for follow-up examination after completed treatment for malignant neoplasm: Secondary | ICD-10-CM | POA: Diagnosis not present

## 2023-09-19 DIAGNOSIS — D0512 Intraductal carcinoma in situ of left breast: Secondary | ICD-10-CM

## 2023-10-13 DIAGNOSIS — D0512 Intraductal carcinoma in situ of left breast: Secondary | ICD-10-CM | POA: Diagnosis not present

## 2023-11-23 DIAGNOSIS — R229 Localized swelling, mass and lump, unspecified: Secondary | ICD-10-CM | POA: Diagnosis not present

## 2023-11-23 DIAGNOSIS — C44529 Squamous cell carcinoma of skin of other part of trunk: Secondary | ICD-10-CM | POA: Diagnosis not present

## 2023-11-23 DIAGNOSIS — L821 Other seborrheic keratosis: Secondary | ICD-10-CM | POA: Diagnosis not present

## 2023-11-23 DIAGNOSIS — Z85828 Personal history of other malignant neoplasm of skin: Secondary | ICD-10-CM | POA: Diagnosis not present

## 2023-11-23 DIAGNOSIS — L578 Other skin changes due to chronic exposure to nonionizing radiation: Secondary | ICD-10-CM | POA: Diagnosis not present

## 2023-11-23 DIAGNOSIS — Z872 Personal history of diseases of the skin and subcutaneous tissue: Secondary | ICD-10-CM | POA: Diagnosis not present

## 2023-11-23 DIAGNOSIS — D1801 Hemangioma of skin and subcutaneous tissue: Secondary | ICD-10-CM | POA: Diagnosis not present

## 2023-11-23 DIAGNOSIS — L814 Other melanin hyperpigmentation: Secondary | ICD-10-CM | POA: Diagnosis not present

## 2023-11-23 DIAGNOSIS — Z08 Encounter for follow-up examination after completed treatment for malignant neoplasm: Secondary | ICD-10-CM | POA: Diagnosis not present

## 2023-12-21 ENCOUNTER — Telehealth: Payer: Self-pay | Admitting: Pharmacist

## 2023-12-21 NOTE — Progress Notes (Signed)
   12/21/2023  Patient ID: Cynthia Baker, female   DOB: 28-Oct-1955, 68 y.o.   MRN: [id number]  Patient appeared on insurance report for at-risk for failing 2025 metric: Medication Adherence for Cholesterol (MAC)   Medication: Atorvastatin 40mg  Last fill date: 10/06/23 30DS  Last filled 12/12/23 for 30DS, however the fail date was 12/02/23  Due to this, patient failed adherence metric for 2025.    Aloysius Lewis, PharmD [facility]  Phone Number: [phone]

## 2024-02-14 ENCOUNTER — Other Ambulatory Visit (HOSPITAL_COMMUNITY): Payer: Self-pay | Admitting: Internal Medicine

## 2024-02-14 DIAGNOSIS — M859 Disorder of bone density and structure, unspecified: Secondary | ICD-10-CM | POA: Diagnosis not present

## 2024-02-14 DIAGNOSIS — E7800 Pure hypercholesterolemia, unspecified: Secondary | ICD-10-CM | POA: Diagnosis not present

## 2024-02-14 DIAGNOSIS — Z136 Encounter for screening for cardiovascular disorders: Secondary | ICD-10-CM

## 2024-02-14 DIAGNOSIS — C50919 Malignant neoplasm of unspecified site of unspecified female breast: Secondary | ICD-10-CM | POA: Diagnosis not present

## 2024-02-14 DIAGNOSIS — E1169 Type 2 diabetes mellitus with other specified complication: Secondary | ICD-10-CM | POA: Diagnosis not present

## 2024-02-14 DIAGNOSIS — I10 Essential (primary) hypertension: Secondary | ICD-10-CM | POA: Diagnosis not present

## 2024-03-14 ENCOUNTER — Ambulatory Visit (HOSPITAL_COMMUNITY)
Admission: RE | Admit: 2024-03-14 | Discharge: 2024-03-14 | Disposition: A | Discharge status: Home / Self Care | Payer: Self-pay | Source: Ambulatory Visit | Attending: Internal Medicine | Admitting: Internal Medicine

## 2024-03-14 DIAGNOSIS — Z136 Encounter for screening for cardiovascular disorders: Secondary | ICD-10-CM | POA: Insufficient documentation

## 2024-04-13 ENCOUNTER — Telehealth: Payer: Self-pay

## 2024-04-13 NOTE — Telephone Encounter (Signed)
 Per MD called pt to confirm appt on 10/20. Appt. Was confirmed

## 2024-04-16 ENCOUNTER — Inpatient Hospital Stay: Payer: Medicare HMO | Attending: Hematology and Oncology | Admitting: Hematology and Oncology

## 2024-04-16 VITALS — BP 158/61 | HR 72 | Temp 97.8°F | Resp 16 | Wt 196.6 lb

## 2024-04-16 DIAGNOSIS — D0512 Intraductal carcinoma in situ of left breast: Secondary | ICD-10-CM | POA: Insufficient documentation

## 2024-04-16 DIAGNOSIS — Z87891 Personal history of nicotine dependence: Secondary | ICD-10-CM | POA: Diagnosis not present

## 2024-04-16 DIAGNOSIS — Z79810 Long term (current) use of selective estrogen receptor modulators (SERMs): Secondary | ICD-10-CM | POA: Insufficient documentation

## 2024-04-16 NOTE — Progress Notes (Signed)
 BRIEF ONCOLOGIC HISTORY:  Oncology History  Ductal carcinoma in situ (DCIS) of left breast  09/14/2021 Mammogram   Mammogram showed calcifications in the left breast warranting further evaluation.  No findings in the right breast suspicious for malignancy. Diagnostic mammogram of the left breast showed calcifications measuring 0.4 x 1 x 0.5 cm   10/15/2021 Pathology Results   Left breast needle core biopsy showed DCIS with calcifications and necrosis ER 100% positive strong staining PR 95% positive moderate staining   11/05/2021 Surgery   Left breast lumpectomy with no residual ductal carcinoma in situ.  Negative for invasive carcinoma   12/15/2021 - 01/13/2022 Radiation Therapy   Site Technique Total Dose (Gy) Dose per Fx (Gy) Completed Fx Beam Energies  Breast, Left: Breast_L 3D 42.56/42.56 2.66 16/16 10X, 6XFFF  Breast, Left: Breast_L_Bst 3D 8/8 2 4/4 6X, 10X     01/2022 -  Anti-estrogen oral therapy   Tamoxifen  daily   04/19/2022 Cancer Staging   Staging form: Breast, AJCC 8th Edition - Clinical: Stage 0 (cTis (DCIS), cN0, cM0, ER+, PR+) - Signed by Crawford Morna Pickle, NP on 04/19/2022     INTERVAL HISTORY:   Discussed the use of AI scribe software for clinical note transcription with the patient, who gave verbal consent to proceed.  History of Present Illness Cynthia Baker is a 68 year old female who presents with persistent hot flashes and sleep disturbances.  She has been experiencing persistent hot flashes for the past 18 years, which began at age 63. These hot flashes cause her to wake up five times a night, which she describes as a 'buzzing' sensation. She has been taking low dose tamoxifen , 10 mg every other day, but continues to experience symptoms. She has not been taking any specific medication for hot flashes aside from tamoxifen .  She has tried magnesium for sleep, which helps her fall asleep initially, but she wakes up due to hot flashes. Once she cools  down, she is able to fall back asleep. She drinks three to four 16-ounce glasses of water daily and does not currently engage in regular exercise.  She plans to travel to [facility] and [place] soon.   Breast: Denies any new nodularity, masses, tenderness, nipple changes, or nipple discharge.      ONCOLOGY TREATMENT TEAM:  1. Surgeon:  Dr. Belinda at [facility] 2. Medical Oncologist: Dr. Loretha  3. Radiation Oncologist: Dr. Dewey    PAST MEDICAL/SURGICAL HISTORY:  Past Medical History:  Diagnosis Date   Breast cancer (HCC) 10/15/2021   Diabetes mellitus without complication (HCC)    type 2   Hypertension    Personal history of radiation therapy    Past Surgical History:  Procedure Laterality Date   BREAST BIOPSY Right 2013   BREAST LUMPECTOMY WITH RADIOACTIVE SEED LOCALIZATION Left 11/05/2021   Procedure: LEFT BREAST LUMPECTOMY WITH RADIOACTIVE SEED LOCALIZATION;  Surgeon: Belinda Cough, MD;  Location: [facility] OR;  Service: General;  Laterality: Left;   DERMOID CYST  EXCISION Right 1994   DILATION AND CURETTAGE OF UTERUS       ALLERGIES:  Allergies  Allergen Reactions   Bee Venom Swelling   Ace Inhibitors     Other reaction(s): cough   Metformin     Other reaction(s): GI     CURRENT MEDICATIONS:  Outpatient Encounter Medications as of 04/16/2024  Medication Sig   atorvastatin (LIPITOR) 40 MG tablet Take 40 mg by mouth daily.   cholecalciferol (VITAMIN D) 25 MCG (  1000 UNIT) tablet Take 1,000 Units by mouth daily.   losartan-hydrochlorothiazide (HYZAAR) 100-12.5 MG tablet Take 1 tablet by mouth daily.   Probiotic Product (PROBIOTIC PO) Take 1 tablet by mouth daily.   tamoxifen  (NOLVADEX ) 10 MG tablet TAKE 1 TABLET (10 MG TOTAL) BY MOUTH DAILY.   TRIJARDY XR 5-2.10-998 MG TB24 Take 2 tablets by mouth daily.   No facility-administered encounter medications on file as of 04/16/2024.     ONCOLOGIC FAMILY HISTORY:  Family History  Problem Relation Age  of Onset   Breast cancer Sister 45      SOCIAL HISTORY:  Social History   Socioeconomic History   Marital status: Married    Spouse name: Not on file   Number of children: Not on file   Years of education: Not on file   Highest education level: Not on file  Occupational History   Not on file  Tobacco Use   Smoking status: Former    Current packs/day: 0.00    Types: Cigarettes    Quit date: 1990    Years since quitting: 35.8   Smokeless tobacco: Never  Vaping Use   Vaping status: Never Used  Substance and Sexual Activity   Alcohol use: Yes    Alcohol/week: 2.0 standard drinks of alcohol    Types: 2 Glasses of wine per week    Comment: 2 glasses of wine a week   Drug use: Never   Sexual activity: Not on file  Other Topics Concern   Not on file  Social History Narrative   Not on file   Social Drivers of Health   Financial Resource Strain: Low Risk  (11/17/2021)   Overall Financial Resource Strain (CARDIA)    Difficulty of Paying Living Expenses: Not hard at all  Food Insecurity: No Food Insecurity (11/17/2021)   Hunger Vital Sign    Worried About Running Out of Food in the Last Year: Never true    Ran Out of Food in the Last Year: Never true  Transportation Needs: No Transportation Needs (11/17/2021)   PRAPARE - Administrator, Civil Service (Medical): No    Lack of Transportation (Non-Medical): No  Physical Activity: Not on file  Stress: Not on file  Social Connections: Unknown (11/10/2021)   Received from [facility]   Social Network    Social Network: Not on file  Intimate Partner Violence: Unknown (10/02/2021)   Received from [facility]   HITS    Physically Hurt: Not on file    Insult or Talk Down To: Not on file    Threaten Physical Harm: Not on file    Scream or Curse: Not on file     OBSERVATIONS/OBJECTIVE:  BP (!) 158/61 (BP Location: Left Arm, Patient Position: Sitting)   Pulse 72   Temp 97.8 F (36.6 C) (Temporal)   Resp 16    Wt 196 lb 9.6 oz (89.2 kg)   SpO2 100%   BMI 29.03 kg/m  GENERAL: Patient is a well appearing female in no acute distress NODES:  No cervical, supraclavicular, or axillary lymphadenopathy palpated.  BREAST EXAM: Left breast status postlumpectomy and radiation no sign of local recurrence right breast is benign. EXTREMITIES:  No peripheral edema.   NEURO:  Nonfocal. Well oriented.  Appropriate affect.   LABORATORY DATA:  None for this visit.  DIAGNOSTIC IMAGING:  None for this visit.    ASSESSMENT AND PLAN:   Ms.. Cynthia Baker is a pleasant 68 y.o. female with Stage 0  left breast DCIS, ER+/PR+, diagnosed in April 2023, treated with lumpectomy, adjuvant radiation therapy, and anti-estrogen therapy with tamoxifen  beginning in August 2023.   She is on adj tamoxifen  5 mg.  Assessment and Plan Assessment & Plan Breast cancer Continue adj tamoxifen  No concerns for recurrence Recent mammogram in March showed no abnormalities. - Order follow-up mammogram for March 2026.  Menopausal symptoms (hot flashes and sleep disturbance) Persistent hot flashes and sleep disturbances for 18 years. - Increase water intake to 48-64 ounces daily. - Initiate daily exercise regimen, avoiding exercise close to bedtime. - Consider low-dose Effexor if symptoms persist despite lifestyle modifications.  Time spent: 20 min  *Total Encounter Time as defined by the Centers for Medicare and Medicaid Services includes, in addition to the face-to-face time of a patient visit (documented in the note above) non-face-to-face time: obtaining and reviewing outside history, ordering and reviewing medications, tests or procedures, care coordination (communications with other health care professionals or caregivers) and documentation in the medical record.

## 2024-04-16 NOTE — Progress Notes (Signed)
 BRIEF ONCOLOGIC HISTORY:  Oncology History  Ductal carcinoma in situ (DCIS) of left breast  09/14/2021 Mammogram   Mammogram showed calcifications in the left breast warranting further evaluation.  No findings in the right breast suspicious for malignancy. Diagnostic mammogram of the left breast showed calcifications measuring 0.4 x 1 x 0.5 cm   10/15/2021 Pathology Results   Left breast needle core biopsy showed DCIS with calcifications and necrosis ER 100% positive strong staining PR 95% positive moderate staining   11/05/2021 Surgery   Left breast lumpectomy with no residual ductal carcinoma in situ.  Negative for invasive carcinoma   12/15/2021 - 01/13/2022 Radiation Therapy   Site Technique Total Dose (Gy) Dose per Fx (Gy) Completed Fx Beam Energies  Breast, Left: Breast_L 3D 42.56/42.56 2.66 16/16 10X, 6XFFF  Breast, Left: Breast_L_Bst 3D 8/8 2 4/4 6X, 10X     01/2022 -  Anti-estrogen oral therapy   Tamoxifen  daily   04/19/2022 Cancer Staging   Staging form: Breast, AJCC 8th Edition - Clinical: Stage 0 (cTis (DCIS), cN0, cM0, ER+, PR+) - Signed by Crawford Morna Pickle, NP on 04/19/2022     INTERVAL HISTORY:   The patient, with a history of breast cancer and osteopenia, presents for a routine follow-up visit.    She has not reported any new symptoms or concerns.  REVIEW OF SYSTEMS:  Review of Systems  Constitutional:  Negative for appetite change, chills, fatigue, fever and unexpected weight change.  HENT:   Negative for hearing loss, lump/mass and trouble swallowing.   Eyes:  Negative for eye problems and icterus.  Respiratory:  Negative for chest tightness, cough and shortness of breath.   Cardiovascular:  Negative for chest pain, leg swelling and palpitations.  Gastrointestinal:  Negative for abdominal distention, abdominal pain, constipation, diarrhea, nausea and vomiting.  Endocrine: Positive for hot flashes.  Genitourinary:  Negative for difficulty urinating.    Musculoskeletal:  Negative for arthralgias.  Skin:  Negative for itching and rash.  Neurological:  Negative for dizziness, extremity weakness, headaches and numbness.  Hematological:  Negative for adenopathy. Does not bruise/bleed easily.  Psychiatric/Behavioral:  Negative for depression. The patient is not nervous/anxious.    Breast: Denies any new nodularity, masses, tenderness, nipple changes, or nipple discharge.      ONCOLOGY TREATMENT TEAM:  1. Surgeon:  Dr. Belinda at [facility] 2. Medical Oncologist: Dr. Loretha  3. Radiation Oncologist: Dr. Dewey    PAST MEDICAL/SURGICAL HISTORY:  Past Medical History:  Diagnosis Date  . Breast cancer (HCC) 10/15/2021  . Diabetes mellitus without complication (HCC)    type 2  . Hypertension   . Personal history of radiation therapy    Past Surgical History:  Procedure Laterality Date  . BREAST BIOPSY Right 2013  . BREAST LUMPECTOMY WITH RADIOACTIVE SEED LOCALIZATION Left 11/05/2021   Procedure: LEFT BREAST LUMPECTOMY WITH RADIOACTIVE SEED LOCALIZATION;  Surgeon: Belinda Cough, MD;  Location: [facility] OR;  Service: General;  Laterality: Left;  . DERMOID CYST  EXCISION Right 1994  . DILATION AND CURETTAGE OF UTERUS       ALLERGIES:  Allergies  Allergen Reactions  . Bee Venom Swelling  . Ace Inhibitors     Other reaction(s): cough  . Metformin     Other reaction(s): GI     CURRENT MEDICATIONS:  Outpatient Encounter Medications as of 04/16/2024  Medication Sig  . atorvastatin (LIPITOR) 40 MG tablet Take 40 mg by mouth daily.  . cholecalciferol (VITAMIN D) 25 MCG (1000 UNIT)  tablet Take 1,000 Units by mouth daily.  SABRA losartan-hydrochlorothiazide (HYZAAR) 100-12.5 MG tablet Take 1 tablet by mouth daily.  . Probiotic Product (PROBIOTIC PO) Take 1 tablet by mouth daily.  . tamoxifen  (NOLVADEX ) 10 MG tablet TAKE 1 TABLET (10 MG TOTAL) BY MOUTH DAILY.  SABRA TRIJARDY XR 5-2.10-998 MG TB24 Take 2 tablets by mouth daily.   No  facility-administered encounter medications on file as of 04/16/2024.     ONCOLOGIC FAMILY HISTORY:  Family History  Problem Relation Age of Onset  . Breast cancer Sister 41      SOCIAL HISTORY:  Social History   Socioeconomic History  . Marital status: Married    Spouse name: Not on file  . Number of children: Not on file  . Years of education: Not on file  . Highest education level: Not on file  Occupational History  . Not on file  Tobacco Use  . Smoking status: Former    Current packs/day: 0.00    Types: Cigarettes    Quit date: 1990    Years since quitting: 35.8  . Smokeless tobacco: Never  Vaping Use  . Vaping status: Never Used  Substance and Sexual Activity  . Alcohol use: Yes    Alcohol/week: 2.0 standard drinks of alcohol    Types: 2 Glasses of wine per week    Comment: 2 glasses of wine a week  . Drug use: Never  . Sexual activity: Not on file  Other Topics Concern  . Not on file  Social History Narrative  . Not on file   Social Drivers of Health   Financial Resource Strain: Low Risk  (11/17/2021)   Overall Financial Resource Strain (CARDIA)   . Difficulty of Paying Living Expenses: Not hard at all  Food Insecurity: No Food Insecurity (11/17/2021)   Hunger Vital Sign   . Worried About Programme researcher, broadcasting/film/video in the Last Year: Never true   . Ran Out of Food in the Last Year: Never true  Transportation Needs: No Transportation Needs (11/17/2021)   PRAPARE - Transportation   . Lack of Transportation (Medical): No   . Lack of Transportation (Non-Medical): No  Physical Activity: Not on file  Stress: Not on file  Social Connections: Unknown (11/10/2021)   Received from [facility]   Social Network   . Social Network: Not on file  Intimate Partner Violence: Unknown (10/02/2021)   Received from [facility]   HITS   . Physically Hurt: Not on file   . Insult or Talk Down To: Not on file   . Threaten Physical Harm: Not on file   . Scream or Curse: Not on  file     OBSERVATIONS/OBJECTIVE:  BP (!) 158/61 (BP Location: Left Arm, Patient Position: Sitting)   Pulse 72   Temp 97.8 F (36.6 C) (Temporal)   Resp 16   Wt 196 lb 9.6 oz (89.2 kg)   SpO2 100%   BMI 29.03 kg/m  GENERAL: Patient is a well appearing female in no acute distress HEENT:  Sclerae anicteric.  Oropharynx clear and moist. No ulcerations or evidence of oropharyngeal candidiasis. Neck is supple.  NODES:  No cervical, supraclavicular, or axillary lymphadenopathy palpated.  BREAST EXAM: Left breast status postlumpectomy and radiation no sign of local recurrence right breast is benign. EXTREMITIES:  No peripheral edema.   NEURO:  Nonfocal. Well oriented.  Appropriate affect.   LABORATORY DATA:  None for this visit.  DIAGNOSTIC IMAGING:  None for this visit.  ASSESSMENT AND PLAN:   Ms.. Mable is a pleasant 68 y.o. female with Stage 0 left breast DCIS, ER+/PR+, diagnosed in April 2023, treated with lumpectomy, adjuvant radiation therapy, and anti-estrogen therapy with tamoxifen  beginning in August 2023.    Mammogram scheduled for September 16, 2022, bone density scheduled for 12/23/2022. She had hot flashes with Tamoxifen  20 mg po daily, hence transitioned to 10 mg po daily. We have discussed role of low dose tamoxifen , ok to transition to Tamoxifen  5 mg po daily.  Breast Cancer General Health Maintenance -Annual follow-up visit with Dr. Belinda  Total time: 30 min  *Total Encounter Time as defined by the Centers for Medicare and Medicaid Services includes, in addition to the face-to-face time of a patient visit (documented in the note above) non-face-to-face time: obtaining and reviewing outside history, ordering and reviewing medications, tests or procedures, care coordination (communications with other health care professionals or caregivers) and documentation in the medical record.

## 2024-05-14 ENCOUNTER — Other Ambulatory Visit: Payer: Self-pay | Admitting: Hematology and Oncology

## 2024-05-14 DIAGNOSIS — D0512 Intraductal carcinoma in situ of left breast: Secondary | ICD-10-CM

## 2024-09-26 ENCOUNTER — Encounter

## 2025-04-16 ENCOUNTER — Inpatient Hospital Stay: Admitting: Hematology and Oncology
